# Patient Record
Sex: Female | Born: 1970
Health system: Southern US, Community
[De-identification: ages and names within clinical notes are randomized; demographics above are authoritative.]

## PROBLEM LIST (undated history)

## (undated) DIAGNOSIS — E119 Type 2 diabetes mellitus without complications: Secondary | ICD-10-CM

## (undated) DIAGNOSIS — R748 Abnormal levels of other serum enzymes: Secondary | ICD-10-CM

## (undated) DIAGNOSIS — I499 Cardiac arrhythmia, unspecified: Secondary | ICD-10-CM

## (undated) DIAGNOSIS — K219 Gastro-esophageal reflux disease without esophagitis: Secondary | ICD-10-CM

## (undated) DIAGNOSIS — R011 Cardiac murmur, unspecified: Secondary | ICD-10-CM

## (undated) DIAGNOSIS — Z8489 Family history of other specified conditions: Secondary | ICD-10-CM

## (undated) DIAGNOSIS — K439 Ventral hernia without obstruction or gangrene: Secondary | ICD-10-CM

## (undated) DIAGNOSIS — K449 Diaphragmatic hernia without obstruction or gangrene: Secondary | ICD-10-CM

## (undated) DIAGNOSIS — K8062 Calculus of gallbladder and bile duct with acute cholecystitis without obstruction: Secondary | ICD-10-CM

## (undated) HISTORY — PX: OOPHORECTOMY: SHX86

## (undated) HISTORY — PX: TUBAL LIGATION: SHX77

---

## 2003-12-10 ENCOUNTER — Ambulatory Visit: Payer: Self-pay | Admitting: Family Medicine

## 2011-06-10 ENCOUNTER — Emergency Department: Payer: Self-pay | Admitting: Emergency Medicine

## 2011-06-10 LAB — COMPREHENSIVE METABOLIC PANEL
Albumin: 3.7 g/dL (ref 3.4–5.0)
Alkaline Phosphatase: 114 U/L (ref 50–136)
BUN: 12 mg/dL (ref 7–18)
Chloride: 105 mmol/L (ref 98–107)
EGFR (African American): >60
EGFR (Non-African Amer.): >60
Potassium: 3.9 mmol/L (ref 3.5–5.1)
SGOT(AST): 24 U/L (ref 15–37)
SGPT (ALT): 31 U/L
Sodium: 140 mmol/L (ref 136–145)

## 2011-06-10 LAB — URINALYSIS, COMPLETE
Glucose,UR: NEGATIVE mg/dL (ref 0–75)
Leukocyte Esterase: NEGATIVE
Nitrite: NEGATIVE
Protein: NEGATIVE
RBC,UR: 1 /HPF (ref 0–5)
Specific Gravity: 1.015 (ref 1.003–1.030)
Squamous Epithelial: <1
WBC UR: <1 /HPF (ref 0–5)

## 2011-06-10 LAB — CBC
MCV: 89 fL (ref 80–100)
Platelet: 211 10*3/uL (ref 150–440)
RBC: 3.97 10*6/uL (ref 3.80–5.20)
RDW: 13.2 % (ref 11.5–14.5)
WBC: 11.1 10*3/uL — ABNORMAL HIGH (ref 3.6–11.0)

## 2011-06-10 LAB — TROPONIN I: Troponin-I: <0.02 ng/mL

## 2013-07-05 ENCOUNTER — Ambulatory Visit: Payer: Self-pay | Admitting: Family Medicine

## 2013-07-12 ENCOUNTER — Ambulatory Visit: Payer: Self-pay | Admitting: Family Medicine

## 2014-10-16 ENCOUNTER — Other Ambulatory Visit: Payer: Self-pay | Admitting: Family Medicine

## 2014-10-16 DIAGNOSIS — Z1231 Encounter for screening mammogram for malignant neoplasm of breast: Secondary | ICD-10-CM

## 2014-10-23 ENCOUNTER — Ambulatory Visit
Admission: RE | Admit: 2014-10-23 | Discharge: 2014-10-23 | Disposition: A | Discharge status: Home / Self Care | Payer: 59 | Source: Ambulatory Visit | Attending: Family Medicine | Admitting: Family Medicine

## 2014-10-23 DIAGNOSIS — Z1231 Encounter for screening mammogram for malignant neoplasm of breast: Secondary | ICD-10-CM | POA: Insufficient documentation

## 2015-03-07 IMAGING — MG MM ADDITIONAL VIEWS AT NO CHARGE
1 series · 2 of 2 positions shown · non-contrast
Comparison: 07/05/2013

CLINICAL DATA: The patient returns after baseline screening exam
for evaluation of the left breast.

EXAM:
DIGITAL DIAGNOSTIC  LEFT MAMMOGRAM
ULTRASOUND LEFT BREAST

[L CC · left · 2 of 2 slices shown]
[im 1/2]
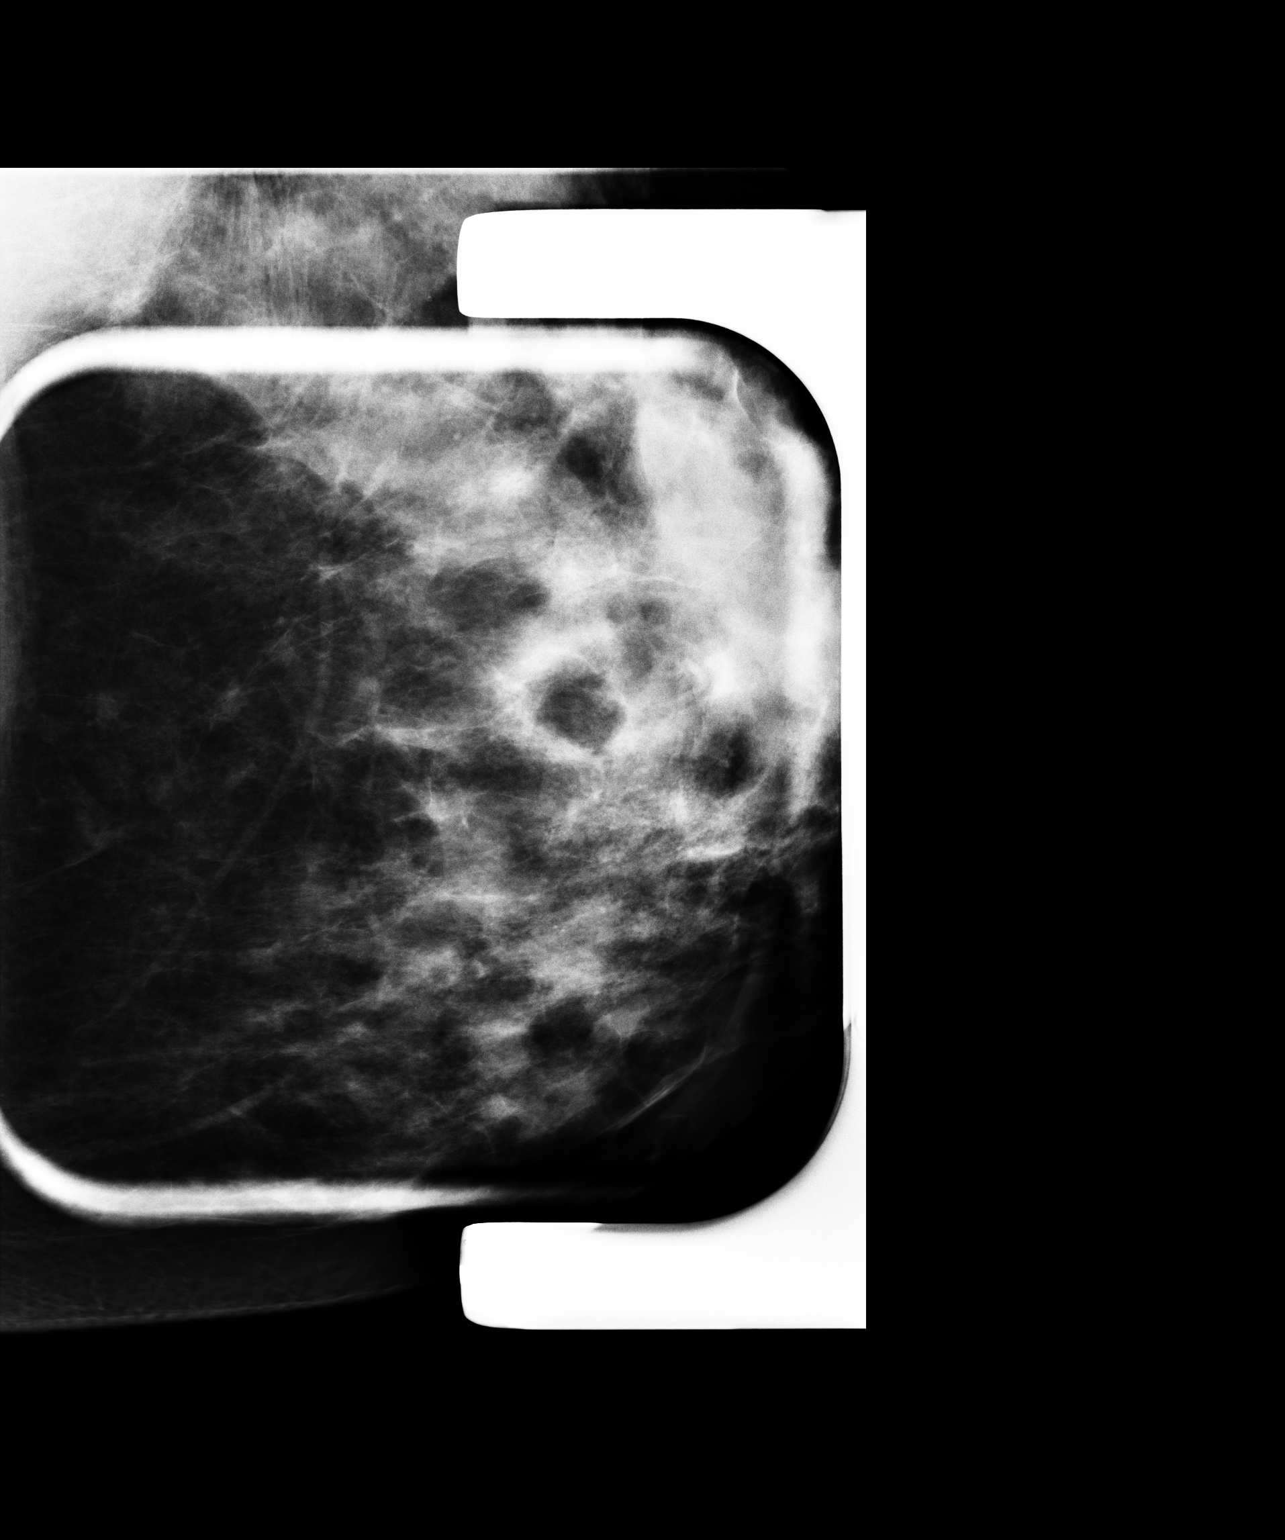
[im 2/2]
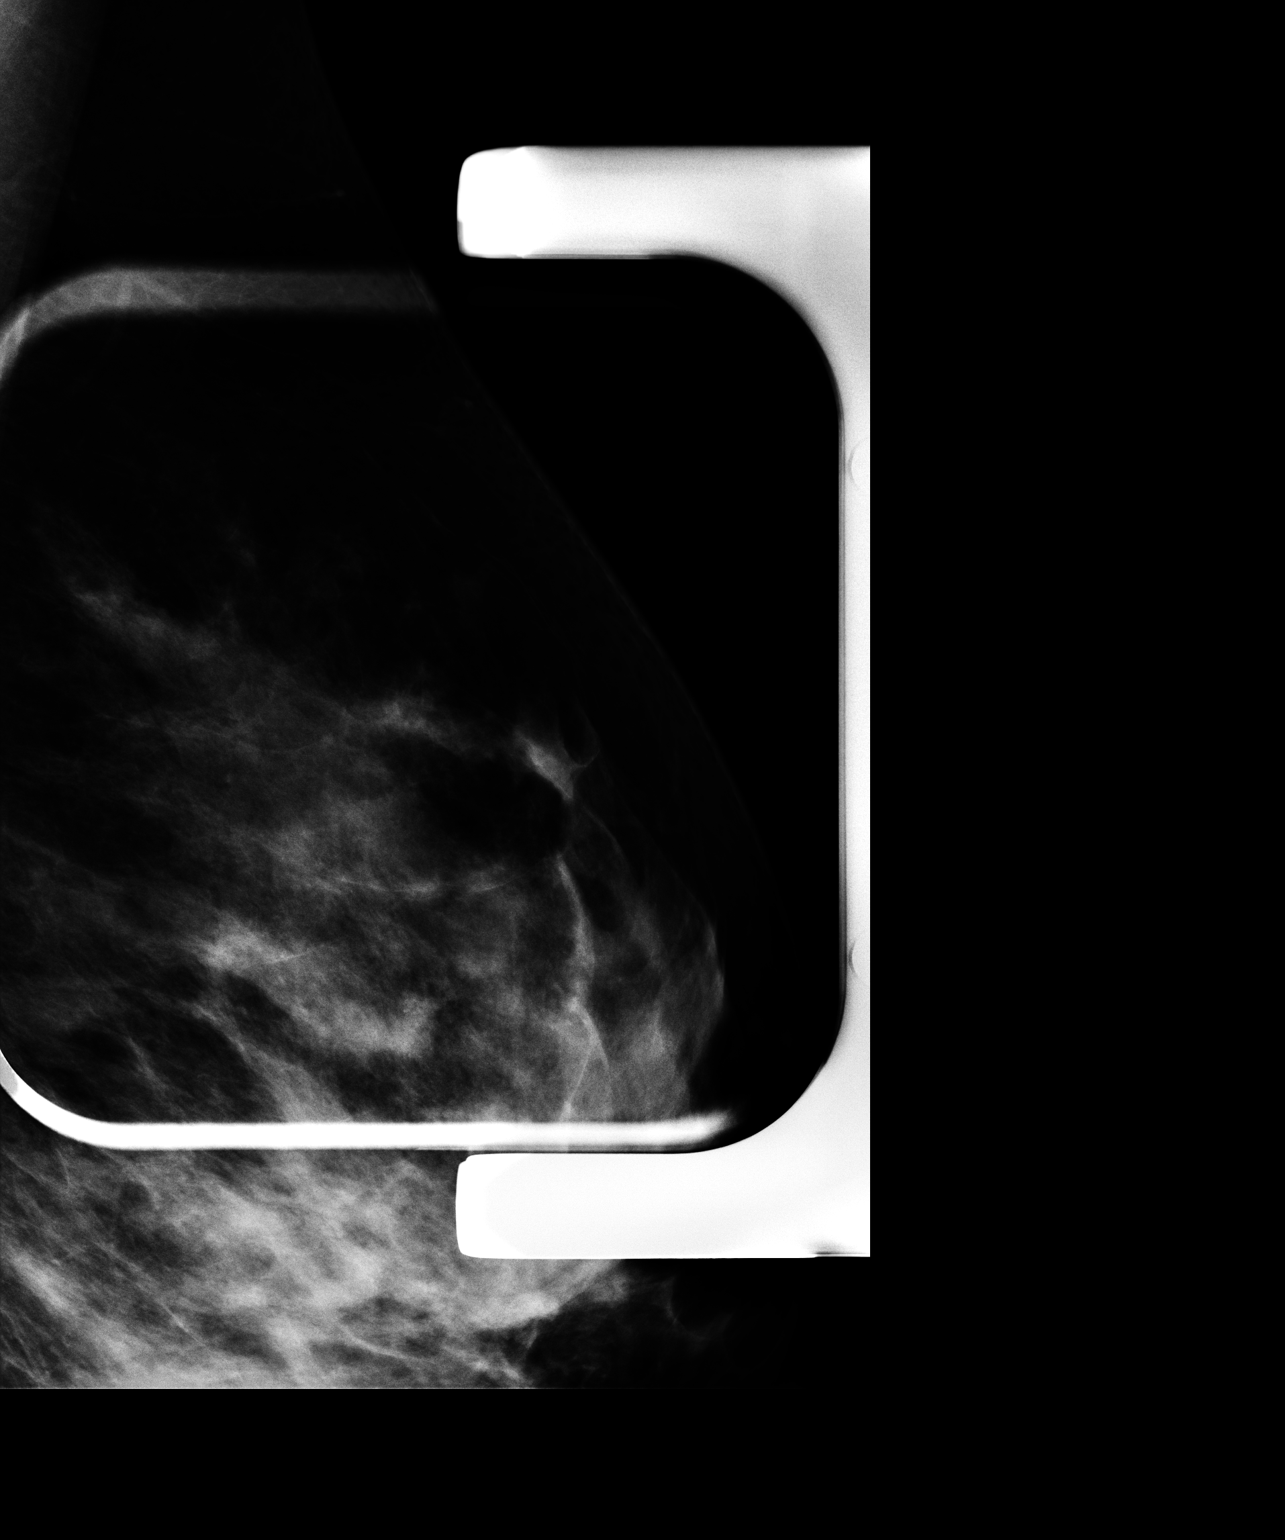

[2 of 2 positions shown; findings below may reference images not displayed]

ACR Breast Density Category c: The breast tissue is heterogeneously
dense, which may obscure small masses.
FINDINGS: Additional views are performed showing normal appearing
fibroglandular tissue in the upper central portion of the left
breast. No suspicious mass, distortion, or microcalcifications are
identified to suggest presence of malignancy.

On physical exam, I palpate no abnormality in the upper central
portion of the left breast.

Ultrasound is performed, showing normal appearing fibroglandular
tissue in the upper central aspect of the left breast. No
distortion, or acoustic shadowing is demonstrated with ultrasound.
IMPRESSION: No mammographic or ultrasound evidence for malignancy.

RECOMMENDATION:
Screening mammogram in one year.(Code:UK-7-7XV)

I have discussed the findings and recommendations with the patient.
Results were also provided in writing at the conclusion of the
visit. If applicable, a reminder letter will be sent to the patient
regarding the next appointment.

BI-RADS CATEGORY  1: Negative.

## 2015-03-07 IMAGING — US US BREAST*L* LIMITED INC AXILLA
1 series · 4 of 4 positions shown · non-contrast
Comparison: 07/05/2013

CLINICAL DATA: The patient returns after baseline screening exam
for evaluation of the left breast.

EXAM:
DIGITAL DIAGNOSTIC  LEFT MAMMOGRAM
ULTRASOUND LEFT BREAST

[Series 1: us breast*left* limited inc axilla · 0.08mm/px · 4 of 4 slices shown]
[im 1/4]
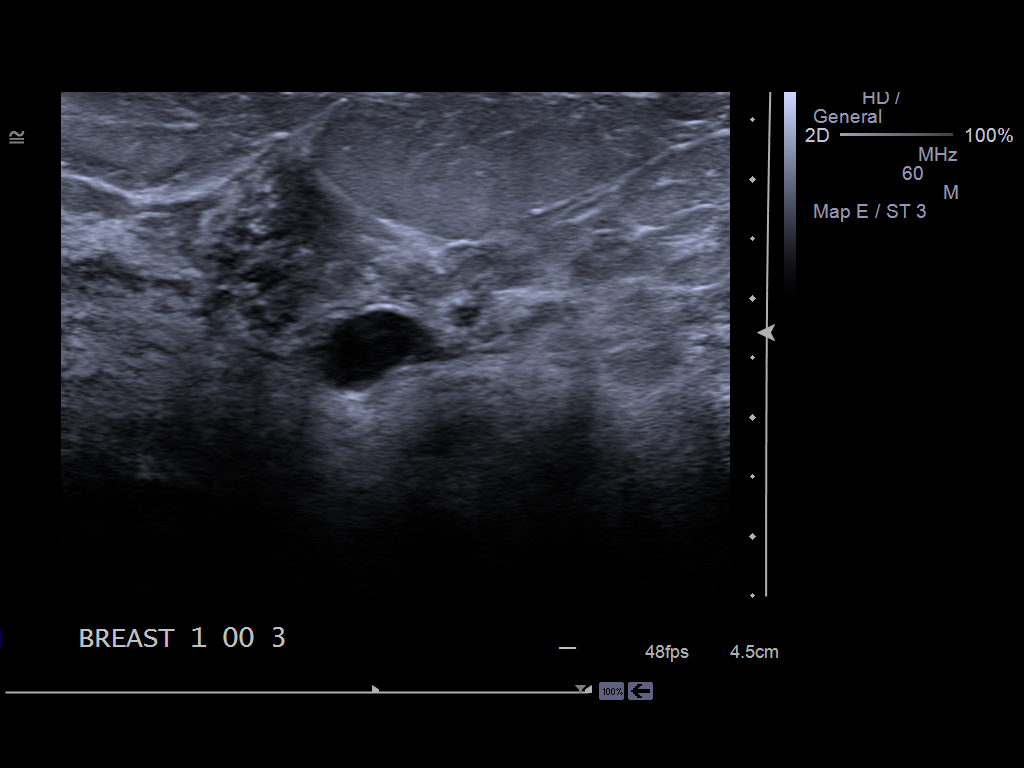
[im 2/4]
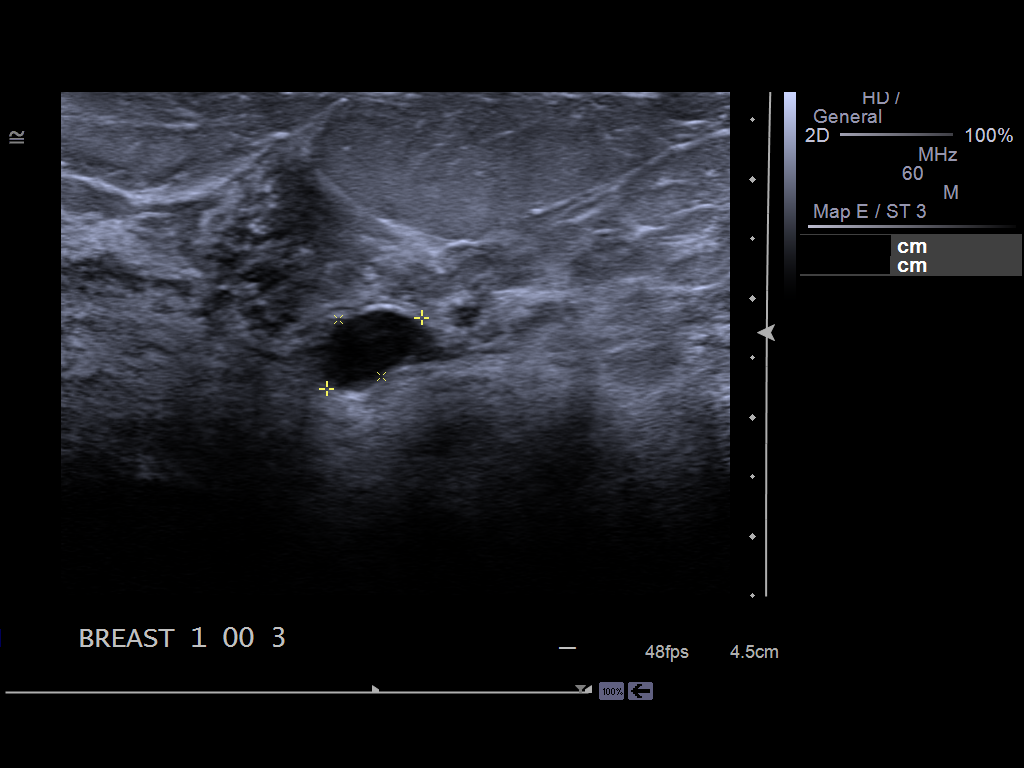
[im 3/4]
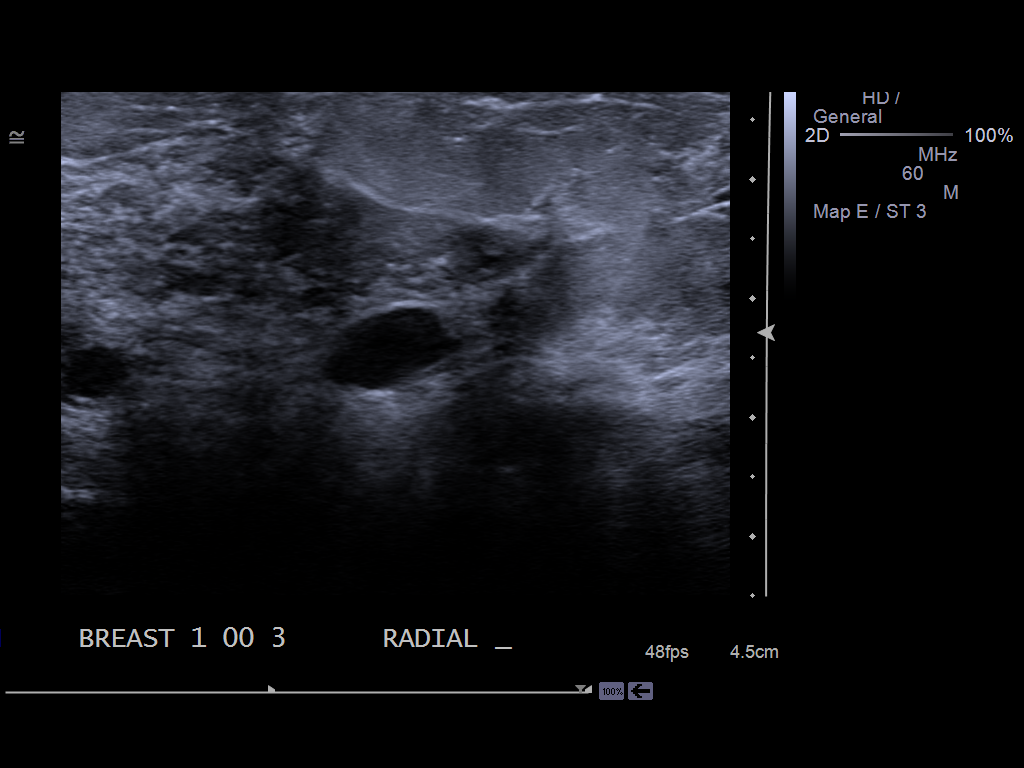
[im 4/4]
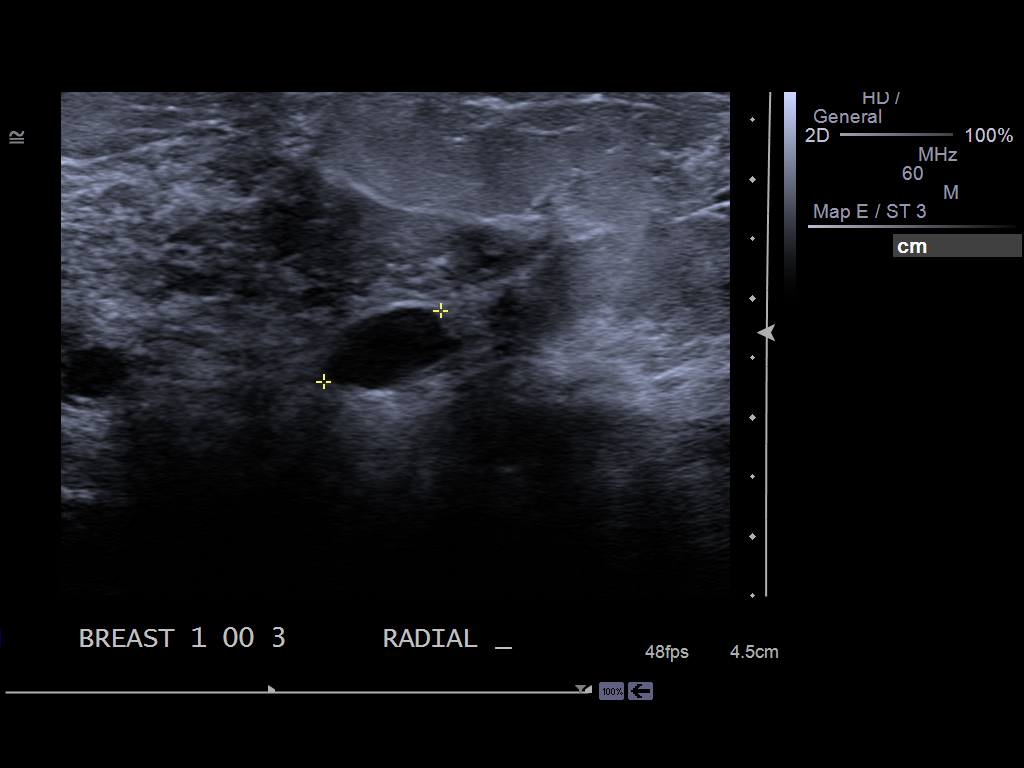

[4 of 4 positions shown; findings below may reference images not displayed]

ACR Breast Density Category c: The breast tissue is heterogeneously
dense, which may obscure small masses.
FINDINGS: Additional views are performed showing normal appearing
fibroglandular tissue in the upper central portion of the left
breast. No suspicious mass, distortion, or microcalcifications are
identified to suggest presence of malignancy.

On physical exam, I palpate no abnormality in the upper central
portion of the left breast.

Ultrasound is performed, showing normal appearing fibroglandular
tissue in the upper central aspect of the left breast. No
distortion, or acoustic shadowing is demonstrated with ultrasound.
IMPRESSION: No mammographic or ultrasound evidence for malignancy.

RECOMMENDATION:
Screening mammogram in one year.(Code:UK-7-7XV)

I have discussed the findings and recommendations with the patient.
Results were also provided in writing at the conclusion of the
visit. If applicable, a reminder letter will be sent to the patient
regarding the next appointment.

BI-RADS CATEGORY  1: Negative.

## 2015-04-14 ENCOUNTER — Other Ambulatory Visit: Payer: Self-pay

## 2015-04-14 ENCOUNTER — Encounter: Payer: Self-pay | Admitting: Emergency Medicine

## 2015-04-14 ENCOUNTER — Emergency Department
Admission: EM | Admit: 2015-04-14 | Discharge: 2015-04-14 | Disposition: A | Discharge status: Home / Self Care | Payer: 59 | Attending: Emergency Medicine | Admitting: Emergency Medicine

## 2015-04-14 ENCOUNTER — Emergency Department: Payer: 59

## 2015-04-14 DIAGNOSIS — B349 Viral infection, unspecified: Secondary | ICD-10-CM | POA: Diagnosis not present

## 2015-04-14 DIAGNOSIS — J690 Pneumonitis due to inhalation of food and vomit: Secondary | ICD-10-CM | POA: Diagnosis not present

## 2015-04-14 DIAGNOSIS — R079 Chest pain, unspecified: Secondary | ICD-10-CM | POA: Diagnosis present

## 2015-04-14 DIAGNOSIS — J159 Unspecified bacterial pneumonia: Secondary | ICD-10-CM | POA: Insufficient documentation

## 2015-04-14 LAB — URINALYSIS COMPLETE WITH MICROSCOPIC (ARMC ONLY)
BACTERIA UA: NONE SEEN
Bilirubin Urine: NEGATIVE
Glucose, UA: 50 mg/dL — AB
Ketones, ur: NEGATIVE mg/dL
Leukocytes, UA: NEGATIVE
Nitrite: NEGATIVE
PH: 5 (ref 5.0–8.0)
PROTEIN: NEGATIVE mg/dL
SPECIFIC GRAVITY, URINE: 1.013 (ref 1.005–1.030)
Squamous Epithelial / LPF: NONE SEEN
WBC UA: NONE SEEN WBC/hpf (ref 0–5)

## 2015-04-14 LAB — CBC
HEMATOCRIT: 36.6 % (ref 35.0–47.0)
HEMOGLOBIN: 12.2 g/dL (ref 12.0–16.0)
MCH: 27.8 pg (ref 26.0–34.0)
MCHC: 33.2 g/dL (ref 32.0–36.0)
MCV: 83.7 fL (ref 80.0–100.0)
Platelets: 246 10*3/uL (ref 150–440)
RBC: 4.37 MIL/uL (ref 3.80–5.20)
RDW: 13.8 % (ref 11.5–14.5)
WBC: 15.4 10*3/uL — ABNORMAL HIGH (ref 3.6–11.0)

## 2015-04-14 LAB — BASIC METABOLIC PANEL
Anion gap: 6 (ref 5–15)
BUN: 12 mg/dL (ref 6–20)
CHLORIDE: 107 mmol/L (ref 101–111)
CO2: 24 mmol/L (ref 22–32)
Calcium: 8.7 mg/dL — ABNORMAL LOW (ref 8.9–10.3)
Creatinine, Ser: 0.8 mg/dL (ref 0.44–1.00)
GFR calc Af Amer: >60 mL/min (ref >60)
GFR calc non Af Amer: >60 mL/min (ref >60)
GLUCOSE: 200 mg/dL — AB (ref 65–99)
POTASSIUM: 3.6 mmol/L (ref 3.5–5.1)
Sodium: 137 mmol/L (ref 135–145)

## 2015-04-14 LAB — TROPONIN I: Troponin I: <0.03 ng/mL (ref <0.031)

## 2015-04-14 MED ORDER — IBUPROFEN 400 MG PO TABS
600.0000 mg | ORAL_TABLET | Freq: Once | ORAL | Status: AC
  Administered 2015-04-14: 600 mg via ORAL

## 2015-04-14 MED ORDER — AZITHROMYCIN 250 MG PO TABS: ORAL_TABLET | ORAL | Status: DC

## 2015-04-14 MED ORDER — ONDANSETRON HCL 4 MG PO TABS: 4.0000 mg | ORAL_TABLET | Freq: Three times a day (TID) | ORAL | Status: DC | PRN

## 2015-04-14 MED ORDER — IBUPROFEN 400 MG PO TABS
ORAL_TABLET | ORAL | Status: AC
  Administered 2015-04-14: 600 mg via ORAL
  Filled 2015-04-14: qty 2

## 2015-04-14 NOTE — Discharge Instructions (Signed)
Neumona extrahospitalaria en los adultos (Community-Acquired Pneumonia, Adult) La neumona es una infeccin en los pulmones. La neumona puede contagiarse mientras una persona est hospitalizada. Cuando una persona no est en el hospital, puede tener un tipo diferente de la enfermedad (neumona extrahospitalaria), la cual se transmite fcilmente de Neomia Dear persona a Educational psychologist. Una persona puede contraer neumona extrahospitalaria si respira cerca de un individuo infectado que tose o estornuda. Algunos sntomas incluyen lo siguiente:  Tos seca.  Tos con expectoracin (productiva).  Grant Ruts.  Sudoracin.  Dolor en el pecho.  CUIDADOS EN EL HOGAR  Tome los medicamentos de venta libre y los recetados solamente como se lo haya indicado el mdico.  Tome los medicamentos para la tos solamente si no puede dormir bien.  Si le recetaron un antibitico, tmelo como se lo haya indicado el mdico. No deje de tomar los antibiticos aunque comience a Actor.  Duerma con la cabeza y el cuello elevados. Para lograrlo, colquese algunas almohadas debajo de la cabeza, o bien puede dormir en un silln reclinable.  No consuma productos que contengan tabaco. Estos incluyen cigarrillos, tabaco para mascar y Administrator, Civil Service. Si necesita ayuda para dejar de fumar, consulte al mdico.  Beba suficiente agua para mantener la orina clara o de color amarillo plido. Una vacuna puede ayudar a prevenir la neumona. Habitualmente, la vacuna se recomienda a:  Las [employer] de 16XWR.  Las [employer] de 19aos:  Las personas que estn recibiendo tratamiento para Management consultant.  Las personas que tienen una enfermedad pulmonar a largo plazo (crnica).  Las [employer] tienen problemas del sistema de defensa del organismo (sistema inmunitario). Tambin puede evitar la neumona si toma las siguientes medidas:  Se aplica la vacuna antigripal todos los [place].  Visita al dentista con la frecuencia  indicada.  Se lava las manos a menudo. Utilice un desinfectante para manos si no dispone de [place] y [place]. SOLICITE AYUDA SI:  Tiene fiebre.  No puede dormir bien porque el medicamento para la tos no resulta eficaz. SOLICITE AYUDA DE INMEDIATO SI:  Le falta el aire y este sntoma [place].  Aumenta el dolor en el pecho.  La enfermedad empeora. Esto es muy grave si:  Es un adulto mayor.  El sistema de defensa del organismo est debilitado.  Tose y escupe sangre.   Esta informacin no tiene Theme park manager el consejo del mdico. Asegrese de hacerle al mdico cualquier pregunta que tenga.   Document Released: 07/29/2009 Document Revised: 10/30/2014 Elsevier Interactive Patient Education [employer]  Please return immediately if condition worsens. Please contact her primary physician or the physician you were given for referral. If you have any specialist physicians involved in her treatment and plan please also contact them. Thank you for using  regional emergency Department. These drink plenty of fluids and return here if he get persistent shortness of breath, high fever,, or any other new concerns

## 2015-04-14 NOTE — ED Notes (Signed)
MD at bedside. 

## 2015-04-14 NOTE — ED Notes (Signed)
Patient to [facility] today to be seen for c/o chest pain.  States pain began last night at 2330. Patient also c/o cough that started today.

## 2015-04-14 NOTE — ED Provider Notes (Signed)
Time Seen: Approximately 1800  I have reviewed the triage notes  Chief Complaint: Chest Pain   History of Present Illness: Amanda Ashley is a 45 y.o. female *who presents with referral from [facility] clinic for evaluation of chest pain. Patient states her chest pain started last night at 11:30 PM and lasted throughout the day today. She states she's had a persistent cough which is been overall nonproductive. She denies any nausea, vomiting. She's had some mild shortness of breath. She denies any pulmonary emboli risk factors. She has no cardiac risk factors at this time. She has had some occasional nausea with no persistent vomiting today. But states she did vomit multiple times last night. She denies any loose stool or diarrhea. She denies any melena or hematochezia. Patient is Hispanic though seems to speak and understand English at this time.   History reviewed. No pertinent past medical history.  There are no active problems to display for this patient.   History reviewed. No pertinent past surgical history.  History reviewed. No pertinent past surgical history.  Current Outpatient Rx  Name  Route  Sig  Dispense  Refill  . azithromycin (ZITHROMAX Z-PAK) 250 MG tablet      Take 2 tablets (500 mg) on  Day 1,  followed by 1 tablet (250 mg) once daily on Days 2 through 5.   6 each   0   . ondansetron (ZOFRAN) 4 MG tablet   Oral   Take 1 tablet (4 mg total) by mouth every 8 (eight) hours as needed for nausea or vomiting.   21 tablet   0     Allergies:  Review of patient's allergies indicates no known allergies.  Family History: No family history on file.  Social History: Social History  Substance Use Topics  . Smoking status: Never Smoker   . Smokeless tobacco: Never Used  . Alcohol Use: No     Review of Systems:   10 point review of systems was performed and was otherwise negative:  Constitutional: No fever Eyes: No visual disturbances ENT: No sore  throat, ear pain Cardiac: Chest pain is substernal and is worse with coughing. She denies any radiation of pain to the arm or jaw area. Respiratory: No shortness of breath, wheezing, or stridor Abdomen: No abdominal pain, no vomiting, No diarrhea Endocrine: No weight loss, No night sweats Extremities: No peripheral edema, cyanosis Skin: No rashes, easy bruising Neurologic: No focal weakness, trouble with speech or swollowing Urologic: No dysuria, Hematuria, or urinary frequency   Physical Exam:  ED Triage Vitals  Enc Vitals Group     BP 04/14/15 1551 128/66 mmHg     Pulse Rate 04/14/15 1551 104     Resp 04/14/15 1551 16     Temp 04/14/15 1551 98.9 F (37.2 C)     Temp Source 04/14/15 1551 Oral     SpO2 04/14/15 1551 97 %     Weight 04/14/15 1551 224 lb (101.606 kg)     Height 04/14/15 1551  (1.651 m)     Head Cir --      Peak Flow --      Pain Score 04/14/15 1552 6     Pain Loc --      Pain Edu? --      Excl. in GC? --     General: Awake , Alert , and Oriented times 3; GCS 15 Head: Normal cephalic , atraumatic Eyes: Pupils equal , round, reactive to light  Nose/Throat: No nasal drainage, patent upper airway without erythema or exudate.  Neck: Supple, Full range of motion, No anterior adenopathy or palpable thyroid masses Lungs: Clear to ascultation without wheezes , rhonchi, or rales Heart: Regular rate, regular rhythm without murmurs , gallops , or rubs Abdomen: Soft, non tender without rebound, guarding , or rigidity; bowel sounds positive and symmetric in all 4 quadrants. No organomegaly .        Extremities: 2 plus symmetric pulses. No edema, clubbing or cyanosis Neurologic: normal ambulation, Motor symmetric without deficits, sensory intact Skin: warm, dry, no rashes Patient's chest discomfort seems to be reproducible palpation across the upper chest wall region without any crepitus or step-off noted  Labs:   All laboratory work was reviewed including any  pertinent negatives or positives listed below:  Labs Reviewed  BASIC METABOLIC PANEL - Abnormal; Notable for the following:    Glucose, Bld 200 (*)    Calcium 8.7 (*)    All other components within normal limits  CBC - Abnormal; Notable for the following:    WBC 15.4 (*)    All other components within normal limits  URINALYSIS COMPLETEWITH MICROSCOPIC ([facility] ONLY) - Abnormal; Notable for the following:    Color, Urine YELLOW (*)    APPearance CLEAR (*)    Glucose, UA 50 (*)    Hgb urine dipstick 2+ (*)    All other components within normal limits  TROPONIN I   Laboratory work shows a slightly elevated white blood cell count otherwise within normal limits. EKG: *  ED ECG REPORT I, Jennye Moccasin, the attending physician, personally viewed and interpreted this ECG.  Date: 04/14/2015 EKG Time: *1546 Rate: 108 Rhythm: Sinus tachycardia  QRS Axis: normal Intervals: normal ST/T Wave abnormalities: Nonspecific ST-T wave changes Conduction Disturbances: none Narrative Interpretation: unremarkable No acute ischemic change   Radiology:     Final result by Rad Results In Interface (04/14/15 16:51:36)   Narrative:   CLINICAL DATA: Chest pain for 2 days  EXAM: CHEST 2 VIEW  COMPARISON: None.  FINDINGS: Cardiac shadow is within normal limits. Mild increased density is noted in the lingula on the left. No sizable effusion is seen. No bony abnormality is noted.  IMPRESSION: Lingular atelectasis.   Electronically Signed By: Alcide Clever M.D. On: 04/14/2015 16:51        I personally reviewed the radiologic studies    ED Course: Differential includes all life-threatening causes for chest pain. This includes but is not exclusive to acute coronary syndrome, aortic dissection, pulmonary embolism, cardiac tamponade, community-acquired pneumonia, pericarditis, musculoskeletal chest wall pain, etc.  Patient seems to have a viral bronchitis versus early  pneumonia picture. I reviewed her x-rays which to me show a slight left lower lobe infiltrate. She has an elevated white blood cell count with persistent cough. I felt this was unlikely to be pulmonary embolism based on her lack of risk factors and with her having pain since last night. No significant findings of ischemia on her EKG nor on her enzyme evaluation. Patient I felt could be treated on an outpatient basis and she was prescribed a Zithromax for community-acquired pneumonia and also given a prescription for Zofran and advised drink plenty of fluids.      Assessment: * Viral syndrome Early community-acquired pneumonia   Plan:  Outpatient management Patient was advised to return immediately if condition worsens. Patient was advised to follow up with their primary care physician or other specialized physicians involved in their outpatient  care            Jennye Moccasin, MD 04/14/15 2252

## 2015-09-02 ENCOUNTER — Encounter: Payer: Self-pay | Admitting: Sports Medicine

## 2015-09-02 ENCOUNTER — Ambulatory Visit (INDEPENDENT_AMBULATORY_CARE_PROVIDER_SITE_OTHER): Payer: 59 | Admitting: Sports Medicine

## 2015-09-02 DIAGNOSIS — M79675 Pain in left toe(s): Secondary | ICD-10-CM

## 2015-09-02 DIAGNOSIS — L603 Nail dystrophy: Secondary | ICD-10-CM

## 2015-09-02 NOTE — Progress Notes (Signed)
Patient ID: Amanda Ashley, female   DOB: 15-Jul-1970, 45 y.o.   MRN: [id number]  Subjective: Amanda Ashley is a 45 y.o. female patient seen today in office with complaint of painful thickened and discolored nails especially at left 1st toe reports 4 years ago had nail removed but grew back thick and discolored; assisted by Daughter who states that she may have had culture in past that was negative and that's why the nail was removed. Patient is desiring treatment for nail changes. Reports that nails are becoming difficult to manage because of the thickness and pain especially at left big toenail. Patient has no other pedal complaints at this time.   There are no active problems to display for this patient.   No current outpatient prescriptions on file prior to visit.   No current facility-administered medications on file prior to visit.    No Known Allergies  Objective: Physical Exam  General: Well developed, nourished, no acute distress, awake, alert and oriented x 3  Vascular: Dorsalis pedis artery 2/4 bilateral, Posterior tibial artery 2/4 bilateral, skin temperature warm to warm proximal to distal bilateral lower extremities, no varicosities, pedal hair present bilateral.  Neurological: Gross sensation present via light touch bilateral.   Dermatological: Skin is warm, dry, and supple bilateral, Nails 1-10 are tender, short thick, and discolored with mild subungal debris especially at left 1st, 5th and right 3rd toenails, no webspace macerations present bilateral, no open lesions present bilateral, no callus/corns/hyperkeratotic tissue present bilateral. No signs of infection bilateral.  Musculoskeletal: No symptomatic boney deformities noted bilateral. Muscular strength within normal limits without painon range of motion. No pain with calf compression bilateral.  Assessment and Plan:  Problem List Items Addressed This Visit    None    Visit Diagnoses    Toe pain, left     -  Primary    Nail dystrophy          -Examined patient -Discussed treatment options for painful dystrophic nails  -Fungal culture was obtained from left 1st and 5th and right 3rd toenails [facility] -Patient to return in 4 weeks for follow up evaluation and discussion of fungal culture results or sooner if symptoms worsen.  Asencion Islamitorya Khya Halls, DPM

## 2015-09-30 ENCOUNTER — Ambulatory Visit: Payer: 59 | Admitting: Sports Medicine

## 2015-10-03 ENCOUNTER — Ambulatory Visit (INDEPENDENT_AMBULATORY_CARE_PROVIDER_SITE_OTHER): Payer: 59 | Admitting: Sports Medicine

## 2015-10-03 ENCOUNTER — Encounter: Payer: Self-pay | Admitting: Sports Medicine

## 2015-10-03 DIAGNOSIS — M79676 Pain in unspecified toe(s): Secondary | ICD-10-CM | POA: Diagnosis not present

## 2015-10-03 DIAGNOSIS — B353 Tinea pedis: Secondary | ICD-10-CM | POA: Diagnosis not present

## 2015-10-03 DIAGNOSIS — B351 Tinea unguium: Secondary | ICD-10-CM | POA: Diagnosis not present

## 2015-10-03 NOTE — Progress Notes (Signed)
Subjective: Paula Comptonna M VAZQUEZ REYES is a 45 y.o. female patient seen today in office for fungal culture results. Patient has no other pedal complaints at this time.   There are no active problems to display for this patient.   No current outpatient prescriptions on file prior to visit.   No current facility-administered medications on file prior to visit.     No Known Allergies  Objective: Physical Exam  General: Well developed, nourished, no acute distress, awake, alert and oriented x 3  Vascular: Dorsalis pedis artery 2/4 bilateral, Posterior tibial artery 2/4 bilateral, skin temperature warm to warm proximal to distal bilateral lower extremities, no varicosities, pedal hair present bilateral.  Neurological: Gross sensation present via light touch bilateral.   Dermatological: Skin is warm, dry, and supple bilateral, Nails 1-10 are tender, short thick, and discolored with mild subungal debris with most involved at right 3rd and left 1st and 5th toenails, no webspace macerations present bilateral, no open lesions present bilateral, no callus/corns/hyperkeratotic tissue present bilateral. No signs of infection bilateral.  Musculoskeletal: No symptomatic boney deformities noted bilateral. Muscular strength within normal limits without painon range of motion. No pain with calf compression bilateral.  Fungal culture +T. Rubrum  Assessment and Plan:  Problem List Items Addressed This Visit    None    Visit Diagnoses    Onychomycosis due to Trichophyton rubrum    -  Primary   Relevant Orders   Hepatic Function Panel   Tinea pedis, unspecified laterality       Pain of toe, unspecified laterality         -Examined patient -Discussed treatment options for painful mycotic nails -Patient opt for oral Lamisil with full understanding of medication risks; ordered LFTs for review if within normal limits will proceed with sending Rx to pharmacy for lamisil 250mg  PO daily. Anticipate 12 week  course.  -Advised good hygiene habits -Patient to return in 6 weeks for follow up evaluation/medication check or sooner if symptoms worsen.  Asencion Islamitorya Axiel Fjeld, DPM

## 2015-10-09 LAB — HEPATIC FUNCTION PANEL
ALBUMIN: 4 g/dL (ref 3.5–5.5)
ALT: 20 IU/L (ref 0–32)
AST: 17 IU/L (ref 0–40)
Alkaline Phosphatase: 133 IU/L — ABNORMAL HIGH (ref 39–117)
BILIRUBIN TOTAL: 0.2 mg/dL (ref 0.0–1.2)
Bilirubin, Direct: 0.07 mg/dL (ref 0.00–0.40)
TOTAL PROTEIN: 6.9 g/dL (ref 6.0–8.5)

## 2015-10-10 ENCOUNTER — Telehealth: Payer: Self-pay | Admitting: *Deleted

## 2015-10-10 NOTE — Telephone Encounter (Addendum)
-----   Message from Asencion Islamitorya Stover, [place] sent at 10/06/2015 11:37 AM EDT ----- Call patient to inform her that her Alkaline Phosphatase is marginally high. Will plan to repeat blood work when she comes to office in 6 weeks. If the labs are still high then will ask patient to follow up with PCP to determine the cause of elevated ALP.  Higher than normal levels of ALP in your blood may indicate a problem with your liver or gallbladder. This could include hepatitis (liver inflammation), cirrhosis (liver scarring), liver cancer, gallstones, or a blockage in your bile ducts. Also these values can be elevated when you are dehydrated or secondary to prescription medications.  -Dr. Marylene LandStover 10/10/2015-Informed pt of Dr. Wynema BirchStover's review of labs and concerns and told pt to make an appt for 6 weeks.

## 2015-11-14 ENCOUNTER — Ambulatory Visit (INDEPENDENT_AMBULATORY_CARE_PROVIDER_SITE_OTHER): Payer: 59 | Admitting: Podiatry

## 2015-11-14 DIAGNOSIS — B351 Tinea unguium: Secondary | ICD-10-CM | POA: Diagnosis not present

## 2015-11-14 DIAGNOSIS — L609 Nail disorder, unspecified: Secondary | ICD-10-CM

## 2015-11-14 DIAGNOSIS — M79609 Pain in unspecified limb: Secondary | ICD-10-CM

## 2015-11-14 DIAGNOSIS — L603 Nail dystrophy: Secondary | ICD-10-CM

## 2015-11-14 DIAGNOSIS — Z79899 Other long term (current) drug therapy: Secondary | ICD-10-CM

## 2015-11-14 DIAGNOSIS — L608 Other nail disorders: Secondary | ICD-10-CM

## 2015-11-15 LAB — HEPATIC FUNCTION PANEL
ALT: 15 IU/L (ref 0–32)
AST: 15 IU/L (ref 0–40)
Albumin: 4.4 g/dL (ref 3.5–5.5)
Alkaline Phosphatase: 138 IU/L — ABNORMAL HIGH (ref 39–117)
Bilirubin Total: 0.2 mg/dL (ref 0.0–1.2)
Bilirubin, Direct: 0.04 mg/dL (ref 0.00–0.40)
Total Protein: 7.1 g/dL (ref 6.0–8.5)

## 2015-11-16 NOTE — Progress Notes (Signed)
Subjective: Patient presents today for possible treatment and evaluation of fungal nails bilaterally 1 through 5. Patient states that the nails have been discolored and thickened for greater than 1 month. Patient presents today for further treatment and evaluation.  Patient states that approximately 6 weeks prior to presentation she did have an LFT performed at which time she had high alkaline phosphatase levels. She was unable to begin oral antifungal medication treatment therapy.  Objective: Physical Exam General: The patient is alert and oriented x3 in no acute distress.  Dermatology: Hyperkeratotic, discolored, thickened, onychodystrophy of nails noted bilaterally.  Skin is warm, dry and supple bilateral lower extremities. Negative for open lesions or macerations.  Vascular: Palpable pedal pulses bilaterally. No edema or erythema noted. Capillary refill within normal limits.  Neurological: Epicritic and protective threshold grossly intact bilaterally.   Musculoskeletal Exam: Range of motion within normal limits to all pedal and ankle joints bilateral. Muscle strength 5/5 in all groups bilateral.   Assessment: #1 onychodystrophy bilateral toenails #2 possible onychomycosis #3 hyperkeratotic nails bilateral  Problem List Items Addressed This Visit    None    Visit Diagnoses    Encounter for long-term (current) use of medications    -  Primary   Relevant Orders   Hepatic Function Panel (Completed)      Plan of Care:  #1 Patient was evaluated. #2 Today we are going to reorder the liver function tests to see if her alkaline phosphatase levels have dropped down to normal levels. #3. LFTs are normal prescription for Lamisil oral and Naftin 2% gel will be prescribed for the patient. #4 patient is to return to the clinic in 3 months   Dr. Felecia ShellingBrent M. Nelsy Madonna, DPM [facility]

## 2015-11-17 ENCOUNTER — Other Ambulatory Visit: Payer: Self-pay

## 2015-11-17 DIAGNOSIS — Z79899 Other long term (current) drug therapy: Secondary | ICD-10-CM

## 2015-11-18 ENCOUNTER — Other Ambulatory Visit: Payer: Self-pay | Admitting: Podiatry

## 2015-11-19 LAB — GAMMA GT: GGT: 18 IU/L (ref 0–60)

## 2015-11-19 NOTE — Progress Notes (Signed)
Let patient know her GGT levels are normal, meaning that her elevated AP levels are coming from somewhere else in her body, likely bone.   Follow up with her PCP. Recommend triple phase bone scan.

## 2015-12-10 ENCOUNTER — Telehealth: Payer: Self-pay | Admitting: Podiatry

## 2015-12-10 NOTE — Telephone Encounter (Signed)
Pt called wanting too know her results from her liver test?

## 2015-12-12 NOTE — Telephone Encounter (Signed)
GGT test is normal... Indicating that her liver is functioning fine. She needs to follow up with her PCP for further investigation to find out where her elevated Alk Phos is coming from. - Dr. Amalia Hailey

## 2015-12-12 NOTE — Telephone Encounter (Addendum)
Pt called for her last lab results. Informed pt of Dr. Logan BoresEvans review of results and recommendation to go to PCP. Pt states she sees Elbert Ewingsheryl Paulette Lindsey, FNP [facility].  Hessie KnowsI Googled the facility and [facility] resulted with [phone], fax [phone]. I confirmed with pt. Faxed labs.

## 2017-03-21 DIAGNOSIS — E559 Vitamin D deficiency, unspecified: Secondary | ICD-10-CM | POA: Diagnosis not present

## 2017-03-21 DIAGNOSIS — M25562 Pain in left knee: Secondary | ICD-10-CM | POA: Diagnosis not present

## 2017-04-04 DIAGNOSIS — E559 Vitamin D deficiency, unspecified: Secondary | ICD-10-CM | POA: Diagnosis not present

## 2017-04-04 DIAGNOSIS — R5383 Other fatigue: Secondary | ICD-10-CM | POA: Diagnosis not present

## 2017-04-04 DIAGNOSIS — E7800 Pure hypercholesterolemia, unspecified: Secondary | ICD-10-CM | POA: Diagnosis not present

## 2017-04-04 DIAGNOSIS — R7611 Nonspecific reaction to tuberculin skin test without active tuberculosis: Secondary | ICD-10-CM | POA: Diagnosis not present

## 2017-07-01 DIAGNOSIS — E119 Type 2 diabetes mellitus without complications: Secondary | ICD-10-CM | POA: Diagnosis not present

## 2017-07-01 DIAGNOSIS — E559 Vitamin D deficiency, unspecified: Secondary | ICD-10-CM | POA: Diagnosis not present

## 2017-07-01 DIAGNOSIS — E041 Nontoxic single thyroid nodule: Secondary | ICD-10-CM | POA: Diagnosis not present

## 2017-07-01 DIAGNOSIS — R7303 Prediabetes: Secondary | ICD-10-CM | POA: Diagnosis not present

## 2017-07-01 DIAGNOSIS — E7800 Pure hypercholesterolemia, unspecified: Secondary | ICD-10-CM | POA: Diagnosis not present

## 2018-02-06 DIAGNOSIS — J40 Bronchitis, not specified as acute or chronic: Secondary | ICD-10-CM | POA: Diagnosis not present

## 2018-04-04 ENCOUNTER — Ambulatory Visit: Payer: 59 | Admitting: Podiatry

## 2018-04-04 ENCOUNTER — Other Ambulatory Visit: Payer: Self-pay | Admitting: Podiatry

## 2018-04-04 ENCOUNTER — Encounter: Payer: Self-pay | Admitting: Podiatry

## 2018-04-04 ENCOUNTER — Ambulatory Visit (INDEPENDENT_AMBULATORY_CARE_PROVIDER_SITE_OTHER): Payer: 59

## 2018-04-04 DIAGNOSIS — M7661 Achilles tendinitis, right leg: Secondary | ICD-10-CM

## 2018-04-04 DIAGNOSIS — M7662 Achilles tendinitis, left leg: Secondary | ICD-10-CM

## 2018-04-04 DIAGNOSIS — B351 Tinea unguium: Secondary | ICD-10-CM | POA: Diagnosis not present

## 2018-04-04 DIAGNOSIS — M779 Enthesopathy, unspecified: Secondary | ICD-10-CM

## 2018-04-04 MED ORDER — TERBINAFINE HCL 250 MG PO TABS: 250.0000 mg | ORAL_TABLET | Freq: Every day | ORAL | 0 refills | Status: DC

## 2018-04-04 MED ORDER — MELOXICAM 15 MG PO TABS: 15.0000 mg | ORAL_TABLET | Freq: Every day | ORAL | 0 refills | Status: DC

## 2018-04-10 NOTE — Progress Notes (Signed)
   HPI: 48 year old female presenting today with a chief complaint of pain to the bilateral Achilles tendons that began about three weeks ago secondary to an injury. She states she fell when the pain began. Walking, wearing shoes and touching the areas increases the pain. She has been massaging the areas for treatment. Patient is here for further evaluation and treatment.   No past medical history on file.    Physical Exam: General: The patient is alert and oriented x3 in no acute distress.  Dermatology: Hyperkeratotic, discolored, thickened, onychodystrophy of nails 1-5 of bilateral feet. Skin is warm, dry and supple bilateral lower extremities. Negative for open lesions or macerations.  Vascular: Palpable pedal pulses bilaterally. No edema or erythema noted. Capillary refill within normal limits.  Neurological: Epicritic and protective threshold grossly intact bilaterally.   Musculoskeletal Exam: Pain on palpation noted to the posterior tubercle of the bilateral calcaneus at the insertion of the Achilles tendon consistent with retrocalcaneal bursitis. Range of motion within normal limits. Muscle strength 5/5 in all muscle groups bilateral lower extremities.  Radiographic Exam:  Posterior calcaneal spur noted to the respective calcaneus on lateral view. No fracture or dislocation noted. Normal osseous mineralization noted.     Assessment: 1. Insertional Achilles tendinitis bilateral  2. Retrocalcaneal bursitis 3. Onychomycosis nails 1-5 bilateral   Plan of Care:  1. Patient was evaluated. Radiographs were reviewed today. 2. Injection of 0.5 mL Celestone Soluspan injected into the retrocalcaneal bursa. Care was taken to avoid direct injection into the Achilles tendon. 3. Prescription for Meloxicam provided to patient. 4. Prescription for Lamisil 250 mg provided to patient.  5. Return to clinic as needed.    Felecia Shelling, DPM [facility]  Dr. Felecia Shelling, DPM    [address]                                        [place], [place] [address]                Office [phone]  Fax [phone]

## 2018-05-20 ENCOUNTER — Other Ambulatory Visit: Payer: Self-pay | Admitting: Podiatry

## 2018-07-19 ENCOUNTER — Other Ambulatory Visit: Payer: Self-pay | Admitting: Podiatry

## 2018-07-24 NOTE — Telephone Encounter (Signed)
Dr. Logan Bores, do you want to give another 90 day supply of Lamisil to this patient or does she need to come back in first?  Please advise

## 2018-07-26 NOTE — Telephone Encounter (Signed)
I don't need to see her back. Please explain to her that it takes 9-12 months for her nails to grow out. We can re-prescribe another round of lamisil in 3-6 months if she needs if the nails haven't improved by then.  - Dr. Logan Bores

## 2018-07-31 ENCOUNTER — Other Ambulatory Visit: Payer: Self-pay | Admitting: Podiatry

## 2018-08-11 ENCOUNTER — Encounter: Payer: Self-pay | Admitting: Podiatry

## 2018-08-11 ENCOUNTER — Ambulatory Visit: Payer: 59 | Admitting: Podiatry

## 2018-08-11 ENCOUNTER — Other Ambulatory Visit: Payer: Self-pay

## 2018-08-11 VITALS — Temp 97.0°F

## 2018-08-11 DIAGNOSIS — B351 Tinea unguium: Secondary | ICD-10-CM | POA: Diagnosis not present

## 2018-08-13 NOTE — Progress Notes (Signed)
   Subjective: 48 y.o. female presenting today for follow up evaluation of onychomycosis of nails 1-5 bilateral. She states the nails look significantly better at this time. She denies any new complaints or modifying factors. She has been taking the Lamisil as directed. Patient is here for further evaluation and treatment.   History reviewed. No pertinent past medical history.  Objective: Physical Exam General: The patient is alert and oriented x3 in no acute distress.  Dermatology: Hyperkeratotic, discolored, thickened, onychodystrophy of the left great toenail. Skin is warm, dry and supple bilateral lower extremities. Negative for open lesions or macerations.  Vascular: Palpable pedal pulses bilaterally. No edema or erythema noted. Capillary refill within normal limits.  Neurological: Epicritic and protective threshold grossly intact bilaterally.   Musculoskeletal Exam: Range of motion within normal limits to all pedal and ankle joints bilateral. Muscle strength 5/5 in all groups bilateral.   Assessment: #1 Onychomycosis left great toenail  #2 Hyperkeratotic nails left great toenail   Plan of Care:  #1 Patient was evaluated. #2 Mechanical debridement of left great toenail performed using a nail nipper. Filed with dremel without incident.  #3 Patient may call in the fall if she feels another round of Lamisil would be beneficial.  #4 Return to clinic as needed.     Edrick Kins, DPM [facility]  Dr. Edrick Kins, [address]                                        [place],  [address]                Office [phone]  Fax [phone]

## 2019-04-21 ENCOUNTER — Ambulatory Visit: Payer: 59

## 2019-04-21 ENCOUNTER — Ambulatory Visit: Payer: Self-pay | Attending: Internal Medicine

## 2019-04-21 DIAGNOSIS — Z23 Encounter for immunization: Secondary | ICD-10-CM | POA: Insufficient documentation

## 2019-04-21 NOTE — Progress Notes (Signed)
   Covid-19 Vaccination Clinic  Name:  JAMISON YUHASZ    MRN: [id number] DOB: [birth year]  04/21/2019  Ms. Veva Holes REYES was observed post Covid-19 immunization for 15 minutes without incidence. She was provided with Vaccine Information Sheet and instruction to access the V-Safe system.   Ms. Veva Holes REYES was instructed to call 911 with any severe reactions post vaccine: Marland Kitchen Difficulty breathing  . Swelling of your face and throat  . A fast heartbeat  . A bad rash all over your body  . Dizziness and weakness    Immunizations Administered    Name Date Dose VIS Date Route   Moderna COVID-19 Vaccine 04/21/2019  2:18 PM 0.5 mL 01/23/2019 Intramuscular   Manufacturer: Moderna   Lot: [id number]   NDC: 70230-172-09

## 2019-05-19 ENCOUNTER — Ambulatory Visit: Payer: Self-pay | Attending: Internal Medicine

## 2019-05-19 DIAGNOSIS — Z23 Encounter for immunization: Secondary | ICD-10-CM

## 2019-05-19 NOTE — Progress Notes (Signed)
   Covid-19 Vaccination Clinic  Name:  Amanda Ashley    MRN: [id number] DOB: 1970-04-17  05/19/2019  Ms. Amanda Ashley was observed post Covid-19 immunization for 15 minutes without incident. She was provided with Vaccine Information Sheet and instruction to access the V-Safe system.   Ms. Amanda Ashley was instructed to call 911 with any severe reactions post vaccine: Marland Kitchen Difficulty breathing  . Swelling of face and throat  . A fast heartbeat  . A bad rash all over body  . Dizziness and weakness   Immunizations Administered    Name Date Dose VIS Date Route   Moderna COVID-19 Vaccine 05/19/2019 10:35 AM 0.5 mL 01/23/2019 Intramuscular   Manufacturer: Gala Murdoch   Lot: [id number]   NDC: 25003-704-88

## 2019-07-17 ENCOUNTER — Other Ambulatory Visit: Payer: Self-pay | Admitting: Internal Medicine

## 2019-07-17 DIAGNOSIS — Z1231 Encounter for screening mammogram for malignant neoplasm of breast: Secondary | ICD-10-CM

## 2019-07-19 ENCOUNTER — Ambulatory Visit
Admission: RE | Admit: 2019-07-19 | Discharge: 2019-07-19 | Disposition: A | Discharge status: Home / Self Care | Payer: 59 | Source: Ambulatory Visit | Attending: Internal Medicine | Admitting: Internal Medicine

## 2019-07-19 DIAGNOSIS — Z1231 Encounter for screening mammogram for malignant neoplasm of breast: Secondary | ICD-10-CM

## 2019-07-24 ENCOUNTER — Other Ambulatory Visit: Payer: Self-pay | Admitting: Internal Medicine

## 2019-07-24 DIAGNOSIS — R928 Other abnormal and inconclusive findings on diagnostic imaging of breast: Secondary | ICD-10-CM

## 2019-07-24 DIAGNOSIS — N6489 Other specified disorders of breast: Secondary | ICD-10-CM

## 2019-07-26 ENCOUNTER — Ambulatory Visit
Admission: RE | Admit: 2019-07-26 | Discharge: 2019-07-26 | Disposition: A | Discharge status: Home / Self Care | Payer: 59 | Source: Ambulatory Visit | Attending: Internal Medicine | Admitting: Internal Medicine

## 2019-07-26 DIAGNOSIS — N6489 Other specified disorders of breast: Secondary | ICD-10-CM

## 2019-07-26 DIAGNOSIS — R928 Other abnormal and inconclusive findings on diagnostic imaging of breast: Secondary | ICD-10-CM

## 2020-08-18 ENCOUNTER — Encounter: Payer: Self-pay | Admitting: Emergency Medicine

## 2020-08-18 ENCOUNTER — Emergency Department
Admission: EM | Admit: 2020-08-18 | Discharge: 2020-08-18 | Disposition: A | Discharge status: Home / Self Care | Payer: 59 | Attending: Emergency Medicine | Admitting: Emergency Medicine

## 2020-08-18 ENCOUNTER — Other Ambulatory Visit: Payer: Self-pay

## 2020-08-18 DIAGNOSIS — F430 Acute stress reaction: Secondary | ICD-10-CM | POA: Insufficient documentation

## 2020-08-18 DIAGNOSIS — R0602 Shortness of breath: Secondary | ICD-10-CM | POA: Diagnosis present

## 2020-08-18 LAB — CBC
HCT: 36.9 % (ref 36.0–46.0)
Hemoglobin: 12.6 g/dL (ref 12.0–15.0)
MCH: 28.4 pg (ref 26.0–34.0)
MCHC: 34.1 g/dL (ref 30.0–36.0)
MCV: 83.1 fL (ref 80.0–100.0)
Platelets: 265 10*3/uL (ref 150–400)
RBC: 4.44 MIL/uL (ref 3.87–5.11)
RDW: 14 % (ref 11.5–15.5)
WBC: 10.6 10*3/uL — ABNORMAL HIGH (ref 4.0–10.5)
nRBC: 0 % (ref 0.0–0.2)

## 2020-08-18 LAB — BASIC METABOLIC PANEL
Anion gap: 7 (ref 5–15)
BUN: 12 mg/dL (ref 6–20)
CO2: 23 mmol/L (ref 22–32)
Calcium: 9.1 mg/dL (ref 8.9–10.3)
Chloride: 106 mmol/L (ref 98–111)
Creatinine, Ser: 0.73 mg/dL (ref 0.44–1.00)
GFR, Estimated: >60 mL/min (ref >60)
Glucose, Bld: 120 mg/dL — ABNORMAL HIGH (ref 70–99)
Potassium: 3.8 mmol/L (ref 3.5–5.1)
Sodium: 136 mmol/L (ref 135–145)

## 2020-08-18 MED ORDER — DIAZEPAM 5 MG PO TABS
5.0000 mg | ORAL_TABLET | Freq: Three times a day (TID) | ORAL | 0 refills | Status: DC | PRN
Start: 2020-08-18 — End: 2023-11-16

## 2020-08-18 NOTE — ED Provider Notes (Signed)
[facility] Emergency Department Provider Note  ____________________________________________  Time seen: Approximately 11:34 AM  I have reviewed the triage vital signs and the nursing notes.   HISTORY  Chief Complaint Panic Attack    HPI Amanda Ashley is a 50 y.o. female with no significant past medical history who comes ED complaining of severe emotional upset which caused her to feel short of breath and have chest tightness.  Started this morning after an argument with her son in which she confronted him for not adhering to their family cultural and religious [employer] values.  She became very upset and and started having the symptoms.  No cough or fever.  No pleuritic pain or exertional symptoms.  Denies history of DVT or PE.  No recent travel trauma hospitalization or surgery.  She reports that her symptoms lasted about 20 minutes and then resolved when she got to the emergency room.  She is initially calm with normal vital signs, and when asked about the incident with her son she becomes quite upset very quickly again.    History reviewed. No pertinent past medical history.   There are no problems to display for this patient.    History reviewed. No pertinent surgical history.   Prior to Admission medications   Medication Sig Start Date End Date Taking? Authorizing Provider  diazepam (VALIUM) 5 MG tablet Take 1 tablet (5 mg total) by mouth every 8 (eight) hours as needed for anxiety. 08/18/20  Yes Sharman Cheek, MD  meloxicam (MOBIC) 15 MG tablet TAKE 1 TABLET(15 MG) BY MOUTH DAILY 05/22/18   Felecia Shelling, DPM  terbinafine (LAMISIL) 250 MG tablet Take 1 tablet (250 mg total) by mouth daily. 04/04/18   Felecia Shelling, DPM     Allergies Patient has no known allergies.   Family History  Problem Relation Age of Onset   Breast cancer Maternal Aunt     Social History Social History   Tobacco Use   Smoking status: Never    Smokeless tobacco: Never  Substance Use Topics   Alcohol use: No   Drug use: No    Review of Systems  Constitutional:   No fever or chills.  ENT:   No sore throat. No rhinorrhea. Cardiovascular:   Positive chest tightness as above without syncope. Respiratory:   Positive shortness of breath without cough. Gastrointestinal:   Negative for abdominal pain, vomiting and diarrhea.  Musculoskeletal:   Negative for focal pain or swelling All other systems reviewed and are negative except as documented above in ROS and HPI.  ____________________________________________   PHYSICAL EXAM:  VITAL SIGNS: ED Triage Vitals  Enc Vitals Group     BP 08/18/20 0846 (!) 157/101     Pulse Rate 08/18/20 0846 (!) 104     Resp 08/18/20 0846 (!) 22     Temp 08/18/20 0846 98 F (36.7 C)     Temp Source 08/18/20 0846 Oral     SpO2 08/18/20 0846 100 %     Weight 08/18/20 0847 223 lb 15.8 oz (101.6 kg)     Height 08/18/20 0847 5\' 5"  (1.651 m)     Head Circumference --      Peak Flow --      Pain Score 08/18/20 0846 6     Pain Loc --      Pain Edu? --      Excl. in GC? --     Vital signs reviewed, nursing assessments reviewed.  Constitutional:   Alert and oriented. Non-toxic appearance. Eyes:   Conjunctivae are normal. EOMI. PERRL. ENT      Head:   Normocephalic and atraumatic.      Nose:   Wearing a mask.      Mouth/Throat:   Wearing a mask.      Neck:   No meningismus. Full ROM. Hematological/Lymphatic/Immunilogical:   No cervical lymphadenopathy. Cardiovascular:   RRR. Symmetric bilateral radial and DP pulses.  No murmurs. Cap refill less than 2 seconds. Respiratory:   Normal respiratory effort without tachypnea/retractions. Breath sounds are clear and equal bilaterally. No wheezes/rales/rhonchi. Gastrointestinal:   Soft and nontender. Non distended. There is no CVA tenderness.  No rebound, rigidity, or guarding. Genitourinary:   deferred Musculoskeletal:   Normal range of motion in all  extremities. No joint effusions.  No lower extremity tenderness.  No edema.  Negative Homans' sign.  Symmetric calf circumference Neurologic:   Normal speech and language.  Motor grossly intact. No acute focal neurologic deficits are appreciated.  Skin:    Skin is warm, dry and intact. No rash noted.  No petechiae, purpura, or bullae.  ____________________________________________    LABS (pertinent positives/negatives) (all labs ordered are listed, but only abnormal results are displayed) Labs Reviewed  BASIC METABOLIC PANEL - Abnormal; Notable for the following components:      Result Value   Glucose, Bld 120 (*)    All other components within normal limits  CBC - Abnormal; Notable for the following components:   WBC 10.6 (*)    All other components within normal limits  POC URINE PREG, ED   ____________________________________________   EKG  Interpreted by me  Date: 08/18/2020  Rate: 98  Rhythm: normal sinus rhythm  QRS Axis: normal  Intervals: normal  ST/T Wave abnormalities: normal  Conduction Disutrbances: none  Narrative Interpretation: unremarkable     ____________________________________________    RADIOLOGY  No results found.  ____________________________________________   PROCEDURES Procedures  ____________________________________________    CLINICAL IMPRESSION / ASSESSMENT AND PLAN / ED COURSE  Medications ordered in the ED: Medications - No data to display  Pertinent labs & imaging results that were available during my care of the patient were reviewed by me and considered in my medical decision making (see chart for details).  Amanda Ashley was evaluated in Emergency Department on 08/18/2020 for the symptoms described in the history of present illness. She was evaluated in the context of the global COVID-19 pandemic, which necessitated consideration that the patient might be at risk for infection with the SARS-CoV-2 virus that causes  COVID-19. Institutional protocols and algorithms that pertain to the evaluation of patients at risk for COVID-19 are in a state of rapid change based on information released by regulatory bodies including the CDC and federal and state organizations. These policies and algorithms were followed during the patient's care in the ED.   Patient presents with typical anxiety attack/stress reaction to emotionally upsetting situation with her son. Considering the patient's symptoms, medical history, and physical examination today, I have low suspicion for ACS, PE, TAD, pneumothorax, carditis, mediastinitis, pneumonia, CHF, or sepsis. Vitals and EKG are normal.  Basic labs are normal, exam is normal.  Very reassuring evaluation today, will send low-dose Valium to her pharmacy, recommend she follow-up with PCP for counseling referral.      ____________________________________________   FINAL CLINICAL IMPRESSION(S) / ED DIAGNOSES    Final diagnoses:  Stress reaction     ED Discharge Orders  Ordered    diazepam (VALIUM) 5 MG tablet  Every 8 hours PRN        08/18/20 1134            Portions of this note were generated with dragon dictation software. Dictation errors may occur despite best attempts at proofreading.   Sharman Cheek, MD 08/18/20 [phone]

## 2020-08-18 NOTE — ED Notes (Signed)
Interpreter requested 

## 2020-08-18 NOTE — ED Triage Notes (Addendum)
C/O high home stress with son. States this morning feeling very anxious and having trouble breathing.  Patient tearful in triage.    Patient currently being treated for pneumonia.  AAOx3.  Skin warm and dry. NAD

## 2021-03-20 IMAGING — US US BREAST*R* LIMITED INC AXILLA
1 series · 10 of 10 positions shown · non-contrast
Comparison: 07/19/2019 and earlier

CLINICAL DATA: Patient returns after screening study for evaluation
of possible RIGHT breast asymmetry.

EXAM:
DIGITAL DIAGNOSTIC RIGHT MAMMOGRAM WITH CAD AND TOMO
ULTRASOUND RIGHT BREAST

[Series 1: us breast*right* limited inc axilla · 0.08mm/px · 10 of 10 slices shown]
[im 1/10]
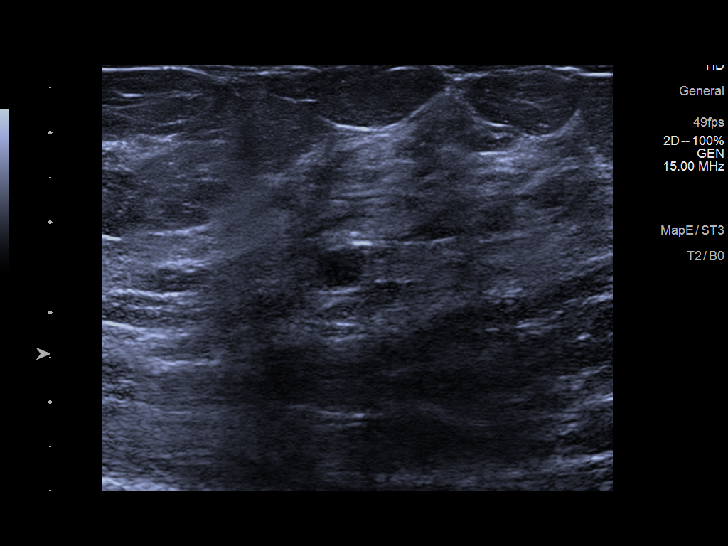
[im 2/10]
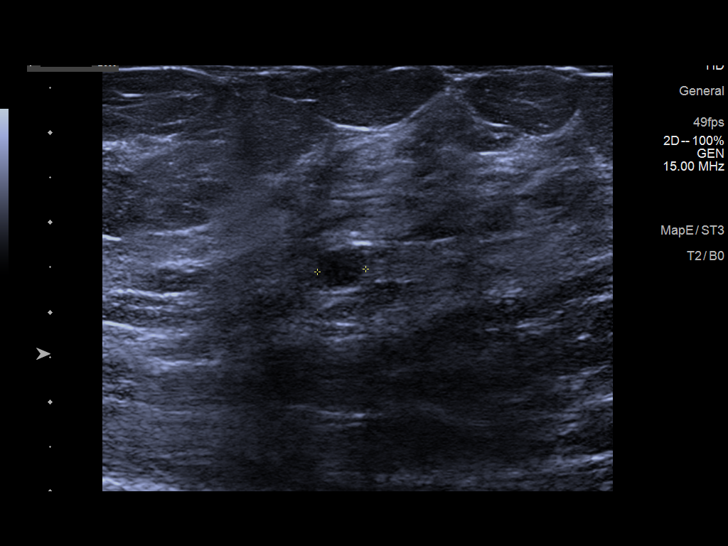
[im 3/10]
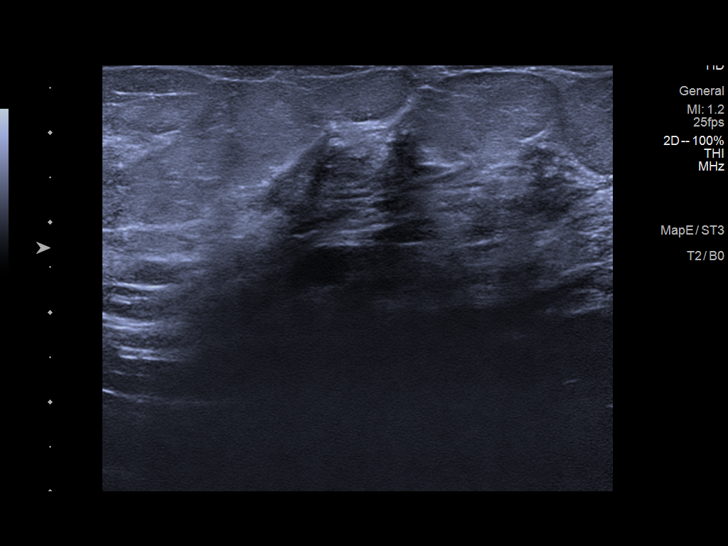
[im 4/10]
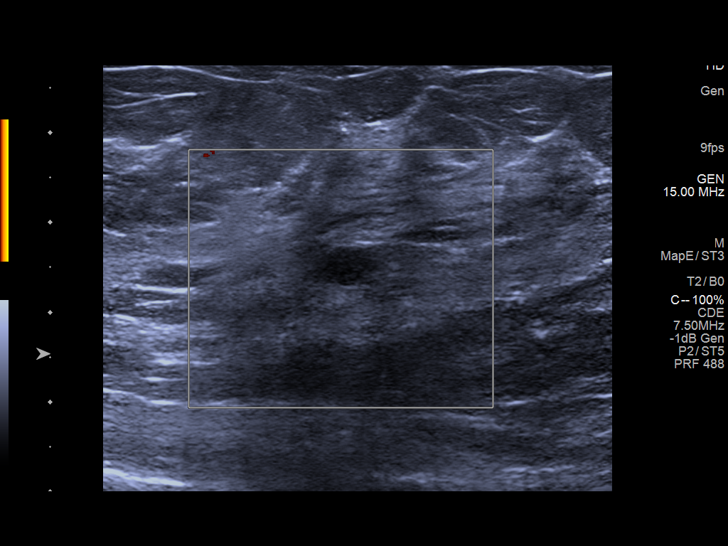
[im 5/10]
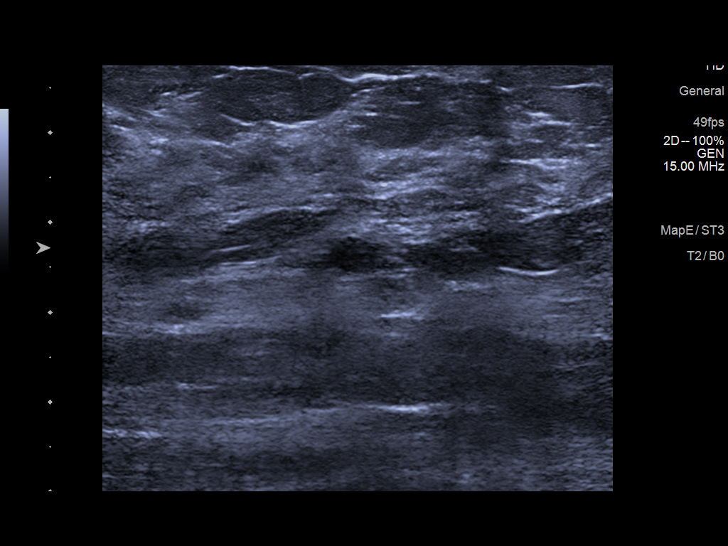
[im 6/10]
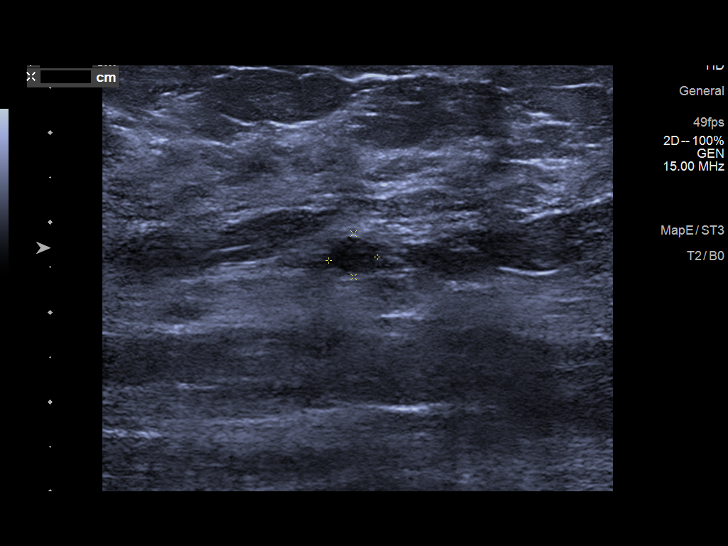
[im 7/10]
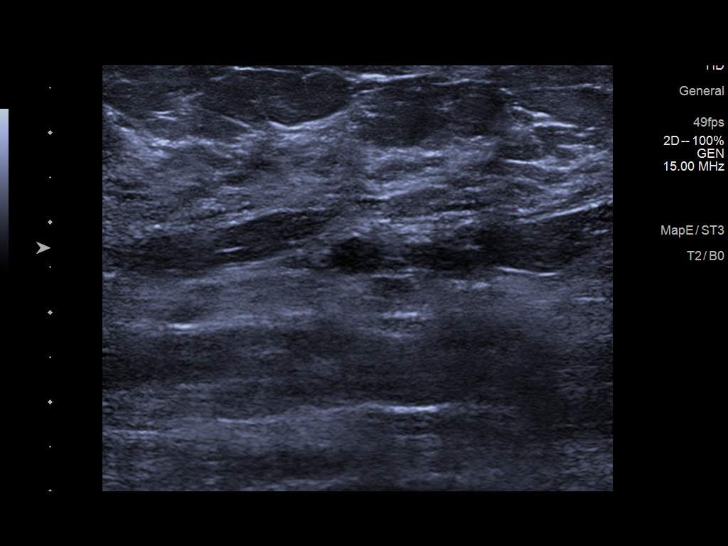
[im 8/10]
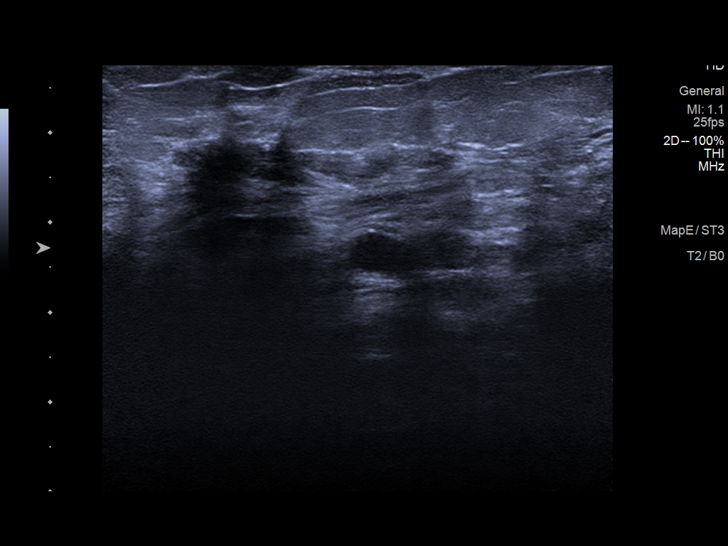
[im 9/10]
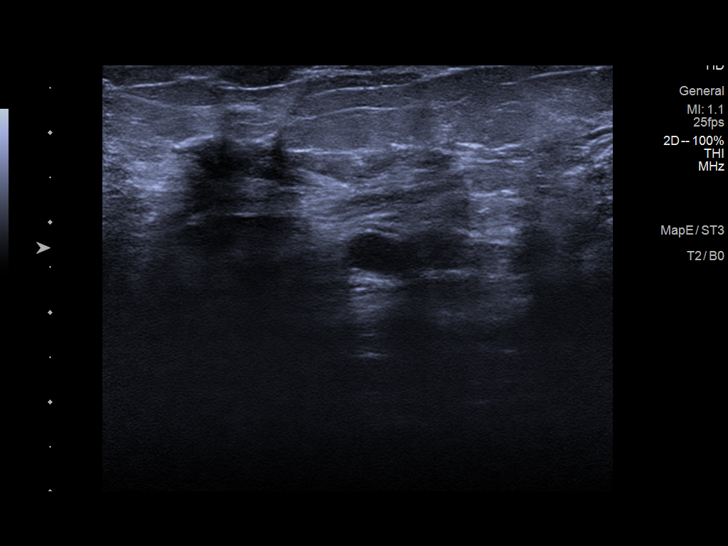
[im 10/10]
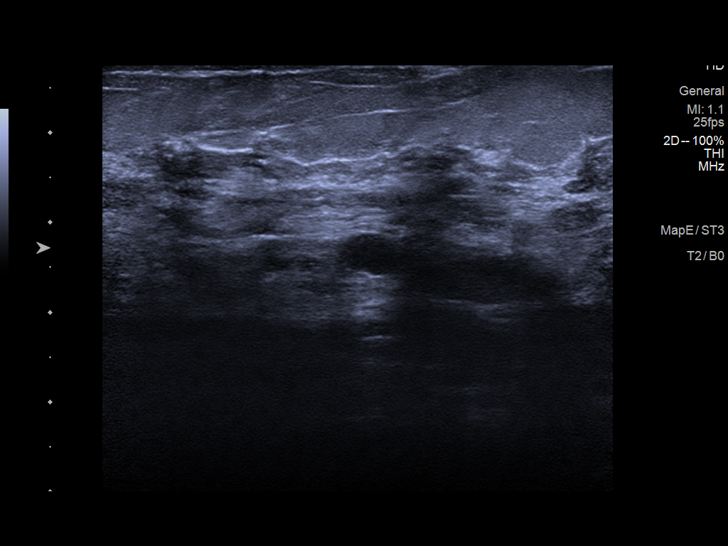

[10 of 10 positions shown; findings below may reference images not displayed]

ACR Breast Density Category c: The breast tissue is heterogeneously
dense, which may obscure small masses.
FINDINGS: Additional 2-D and 3-D images are performed. These views show
extremely dense fibroglandular tissue and possible asymmetry versus
mass just LATERAL to the RIGHT nipple.

Mammographic images were processed with CAD.

Targeted ultrasound is performed, showing dense fibroglandular
tissue in the LATERAL portions of the RIGHT breast. Note is made of
a benign cyst in the 9 o'clock location 5 centimeters from the
nipple which measures 0.5 x 0.5 x 0.7 centimeters.
IMPRESSION: No mammographic or ultrasound evidence for malignancy.

RECOMMENDATION:
Insert screen

I have discussed the findings and recommendations with the patient.
If applicable, a reminder letter will be sent to the patient
regarding the next appointment.

BI-RADS CATEGORY  2: Benign.

## 2021-08-14 ENCOUNTER — Ambulatory Visit (INDEPENDENT_AMBULATORY_CARE_PROVIDER_SITE_OTHER): Payer: 59

## 2021-08-14 ENCOUNTER — Ambulatory Visit: Payer: 59 | Admitting: Podiatry

## 2021-08-14 DIAGNOSIS — M7661 Achilles tendinitis, right leg: Secondary | ICD-10-CM

## 2021-08-14 DIAGNOSIS — M7662 Achilles tendinitis, left leg: Secondary | ICD-10-CM | POA: Diagnosis not present

## 2021-08-14 MED ORDER — METHYLPREDNISOLONE 4 MG PO TBPK: ORAL_TABLET | ORAL | 0 refills | Status: DC

## 2021-08-14 MED ORDER — MELOXICAM 15 MG PO TABS: ORAL_TABLET | ORAL | 1 refills | Status: DC

## 2021-08-14 MED ORDER — BETAMETHASONE SOD PHOS & ACET 6 (3-3) MG/ML IJ SUSP
3.0000 mg | Freq: Once | INTRAMUSCULAR | Status: AC
  Administered 2021-08-14: 3 mg via INTRA_ARTICULAR

## 2021-10-09 ENCOUNTER — Ambulatory Visit: Payer: 59 | Admitting: Podiatry

## 2021-10-09 DIAGNOSIS — M7662 Achilles tendinitis, left leg: Secondary | ICD-10-CM | POA: Diagnosis not present

## 2021-10-09 DIAGNOSIS — M7661 Achilles tendinitis, right leg: Secondary | ICD-10-CM

## 2021-10-09 NOTE — Progress Notes (Signed)
   Chief Complaint  Patient presents with   Foot Pain    Patient is here for follow-up.    HPI: 51 y.o. female presenting today for follow-up evaluation of posterior heel pain to the bilateral feet.  She says that her left hurts more than her right.  This has been ongoing for several years, since before COVID.    Patient states that the injections and Medrol Dosepak did help however she continues to have some pain and tenderness.  She has been wearing good shoes and stretching daily.  No new complaints at this time  No past medical history on file.  No past surgical history on file.  No Known Allergies   Physical Exam: General: The patient is alert and oriented x3 in no acute distress.  Dermatology: Skin is warm, dry and supple bilateral lower extremities. Negative for open lesions or macerations.  Vascular: Palpable pedal pulses bilaterally. No edema or erythema noted. Capillary refill within normal limits.  Neurological: Epicritic and protective threshold grossly intact bilaterally.   Musculoskeletal Exam: There continues to be some pain on palpation noted to the posterior tubercle of the bilateral calcaneus at the insertion of the Achilles tendon consistent with retrocalcaneal bursitis. Range of motion within normal limits. Muscle strength 5/5 in all muscle groups bilateral lower extremities.  Radiographic Exam B/L feet 08/14/2021:  Posterior calcaneal spur noted to the respective calcaneus on lateral view. No fracture or dislocation noted. Normal osseous mineralization noted.     Assessment: 1. Insertional Achilles tendinitis bilateral 2.  Posterior heel spur bilateral  Plan of Care:  1. Patient was evaluated.  2.  Continue meloxicam 15 mg daily 3.  Prescription provided for the patient for physical therapy to take to [facility] 4.  Continue daily range of motion exercises and stretching of the posterior Achilles tendon and calf 5.  Continue wearing good supportive shoes.   Advised against going barefoot 6.  Return to clinic as needed  *Presents today with her daughter, Pattricia Boss (63yrs).  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 Felecia Shelling, DPM    [address] [place], [place] [address]                Office [phone]  Fax [phone]

## 2022-02-01 ENCOUNTER — Encounter: Payer: Self-pay | Admitting: Emergency Medicine

## 2022-02-01 ENCOUNTER — Emergency Department: Payer: 59

## 2022-02-01 ENCOUNTER — Other Ambulatory Visit: Payer: Self-pay

## 2022-02-01 DIAGNOSIS — R1011 Right upper quadrant pain: Secondary | ICD-10-CM | POA: Diagnosis present

## 2022-02-01 DIAGNOSIS — K8000 Calculus of gallbladder with acute cholecystitis without obstruction: Secondary | ICD-10-CM | POA: Diagnosis not present

## 2022-02-01 LAB — CBC
HCT: 38 % (ref 36.0–46.0)
Hemoglobin: 12.5 g/dL (ref 12.0–15.0)
MCH: 28.6 pg (ref 26.0–34.0)
MCHC: 32.9 g/dL (ref 30.0–36.0)
MCV: 87 fL (ref 80.0–100.0)
Platelets: 266 10*3/uL (ref 150–400)
RBC: 4.37 MIL/uL (ref 3.87–5.11)
RDW: 13.2 % (ref 11.5–15.5)
WBC: 10.5 10*3/uL (ref 4.0–10.5)
nRBC: 0 % (ref 0.0–0.2)

## 2022-02-01 LAB — COMPREHENSIVE METABOLIC PANEL
ALT: 16 U/L (ref 0–44)
AST: 18 U/L (ref 15–41)
Albumin: 3.9 g/dL (ref 3.5–5.0)
Alkaline Phosphatase: 187 U/L — ABNORMAL HIGH (ref 38–126)
Anion gap: 8 (ref 5–15)
BUN: 12 mg/dL (ref 6–20)
CO2: 25 mmol/L (ref 22–32)
Calcium: 9 mg/dL (ref 8.9–10.3)
Chloride: 106 mmol/L (ref 98–111)
Creatinine, Ser: 0.72 mg/dL (ref 0.44–1.00)
GFR, Estimated: >60 mL/min (ref >60)
Glucose, Bld: 130 mg/dL — ABNORMAL HIGH (ref 70–99)
Potassium: 3.8 mmol/L (ref 3.5–5.1)
Sodium: 139 mmol/L (ref 135–145)
Total Bilirubin: 0.3 mg/dL (ref 0.3–1.2)
Total Protein: 7.8 g/dL (ref 6.5–8.1)

## 2022-02-01 LAB — URINALYSIS, ROUTINE W REFLEX MICROSCOPIC
Bacteria, UA: NONE SEEN
Bilirubin Urine: NEGATIVE
Glucose, UA: NEGATIVE mg/dL
Ketones, ur: 5 mg/dL — AB
Leukocytes,Ua: NEGATIVE
Nitrite: NEGATIVE
Protein, ur: 100 mg/dL — AB
Specific Gravity, Urine: 1.029 (ref 1.005–1.030)
pH: 5 (ref 5.0–8.0)

## 2022-02-01 LAB — LIPASE, BLOOD: Lipase: 39 U/L (ref 11–51)

## 2022-02-01 LAB — POC URINE PREG, ED: Preg Test, Ur: NEGATIVE

## 2022-02-01 NOTE — ED Triage Notes (Signed)
Pt to ED from home c/o upper mid abd pain since yesterday getting worse today.  Pain is stabbing, hx of acid reflux but feels different.  Nausea with vomiting x1 today.  Denies urinary changes.  Pt A&Ox4, chest rise even and unlabored, skin WNL and in NAD at this time.

## 2022-02-02 ENCOUNTER — Observation Stay
Admission: EM | Admit: 2022-02-02 | Discharge: 2022-02-04 | Disposition: A | Discharge status: Home / Self Care | Payer: 59 | Attending: Surgery | Admitting: Surgery

## 2022-02-02 DIAGNOSIS — K8020 Calculus of gallbladder without cholecystitis without obstruction: Secondary | ICD-10-CM

## 2022-02-02 DIAGNOSIS — K810 Acute cholecystitis: Secondary | ICD-10-CM | POA: Diagnosis present

## 2022-02-02 DIAGNOSIS — K8000 Calculus of gallbladder with acute cholecystitis without obstruction: Secondary | ICD-10-CM | POA: Diagnosis present

## 2022-02-02 HISTORY — DX: Gastro-esophageal reflux disease without esophagitis: K21.9

## 2022-02-02 HISTORY — DX: Cardiac arrhythmia, unspecified: I49.9

## 2022-02-02 MED ORDER — METRONIDAZOLE 500 MG/100ML IV SOLN
500.0000 mg | Freq: Once | INTRAVENOUS | Status: AC
  Administered 2022-02-02: 500 mg via INTRAVENOUS
  Filled 2022-02-02: qty 100

## 2022-02-02 MED ORDER — ONDANSETRON HCL 4 MG/2ML IJ SOLN
4.0000 mg | Freq: Once | INTRAMUSCULAR | Status: AC
  Administered 2022-02-02: 4 mg via INTRAVENOUS
  Filled 2022-02-02: qty 2

## 2022-02-02 MED ORDER — ONDANSETRON HCL 4 MG/2ML IJ SOLN
4.0000 mg | Freq: Four times a day (QID) | INTRAMUSCULAR | Status: DC | PRN
  Administered 2022-02-03: 4 mg via INTRAVENOUS
  Filled 2022-02-02: qty 2

## 2022-02-02 MED ORDER — ONDANSETRON 4 MG PO TBDP: 4.0000 mg | ORAL_TABLET | Freq: Four times a day (QID) | ORAL | Status: DC | PRN

## 2022-02-02 MED ORDER — OXYCODONE HCL 5 MG PO TABS: 5.0000 mg | ORAL_TABLET | ORAL | Status: DC | PRN

## 2022-02-02 MED ORDER — METRONIDAZOLE 500 MG/100ML IV SOLN
500.0000 mg | Freq: Two times a day (BID) | INTRAVENOUS | Status: DC
  Administered 2022-02-02 – 2022-02-04 (×4): 500 mg via INTRAVENOUS
  Filled 2022-02-02 (×5): qty 100

## 2022-02-02 MED ORDER — ACETAMINOPHEN 500 MG PO TABS
1000.0000 mg | ORAL_TABLET | Freq: Four times a day (QID) | ORAL | Status: DC
  Administered 2022-02-02 – 2022-02-04 (×6): 1000 mg via ORAL
  Filled 2022-02-02 (×4): qty 2

## 2022-02-02 MED ORDER — MORPHINE SULFATE (PF) 4 MG/ML IV SOLN
4.0000 mg | Freq: Once | INTRAVENOUS | Status: AC
  Administered 2022-02-02: 4 mg via INTRAVENOUS
  Filled 2022-02-02: qty 1

## 2022-02-02 MED ORDER — SODIUM CHLORIDE 0.9 % IV SOLN
INTRAVENOUS | Status: DC
  Administered 2022-02-02: 100 mL/h via INTRAVENOUS

## 2022-02-02 MED ORDER — PANTOPRAZOLE SODIUM 40 MG IV SOLR
40.0000 mg | Freq: Two times a day (BID) | INTRAVENOUS | Status: DC
  Administered 2022-02-02 – 2022-02-04 (×5): 40 mg via INTRAVENOUS
  Filled 2022-02-02 (×3): qty 10

## 2022-02-02 MED ORDER — ACETAMINOPHEN 500 MG PO TABS
1000.0000 mg | ORAL_TABLET | Freq: Once | ORAL | Status: AC
  Administered 2022-02-02: 1000 mg via ORAL
  Filled 2022-02-02: qty 2

## 2022-02-02 MED ORDER — SODIUM CHLORIDE 0.9 % IV SOLN
1.0000 g | Freq: Once | INTRAVENOUS | Status: AC
  Administered 2022-02-02: 1 g via INTRAVENOUS
  Filled 2022-02-02: qty 10

## 2022-02-02 MED ORDER — INDOCYANINE GREEN 25 MG IV SOLR
2.5000 mg | INTRAVENOUS | Status: AC
  Filled 2022-02-02 (×2): qty 10

## 2022-02-02 MED ORDER — CIPROFLOXACIN IN D5W 400 MG/200ML IV SOLN
400.0000 mg | Freq: Two times a day (BID) | INTRAVENOUS | Status: DC
  Administered 2022-02-02 – 2022-02-04 (×4): 400 mg via INTRAVENOUS
  Filled 2022-02-02 (×3): qty 200

## 2022-02-02 MED ORDER — HYDROMORPHONE HCL 1 MG/ML IJ SOLN
0.5000 mg | INTRAMUSCULAR | Status: DC | PRN
  Administered 2022-02-03 (×2): 0.5 mg via INTRAVENOUS
  Filled 2022-02-02 (×2): qty 0.5

## 2022-02-02 NOTE — ED Provider Notes (Addendum)
[facility] Provider Note    Event Date/Time   First MD Initiated Contact with Patient 02/02/22 [phone]     (approximate)   History   Abdominal Pain   HPI  Amanda Ashley is a 51 y.o. female   Past medical history of no significant past medical history presents with right upper quadrant pain that has been bothering her for the last several days.  Worse after eating big meals.  She has had some nausea and vomiting as well.  No changes in bowel movements, no urinary symptoms.  No fever or chills.    History was obtained via the patient.  Her daughter is available as an independent historian corroborates information as given above.      Physical Exam   Triage Vital Signs: ED Triage Vitals  Enc Vitals Group     BP 02/01/22 2018 (!) 160/89     Pulse Rate 02/01/22 2018 99     Resp 02/01/22 2018 18     Temp 02/01/22 2018 98.7 F (37.1 C)     Temp Source 02/01/22 2018 Oral     SpO2 02/01/22 2018 95 %     Weight 02/01/22 2014 280 lb (127 kg)     Height 02/01/22 2014 5\' 5"  (1.651 m)     Head Circumference --      Peak Flow --      Pain Score 02/01/22 2013 10     Pain Loc --      Pain Edu? --      Excl. in GC? --     Most recent vital signs: Vitals:   02/02/22 0536 02/02/22 0549  BP: (!) 175/97   Pulse: (!) 111   Resp: 20   Temp:  98.3 F (36.8 C)  SpO2: 98%     General: Awake, no distress.  CV:  Good peripheral perfusion.  Resp:  Normal effort.  Abd:  No distention.  Right upper quadrant tenderness Other:  No fever.   ED Results / Procedures / Treatments   Labs (all labs ordered are listed, but only abnormal results are displayed) Labs Reviewed  COMPREHENSIVE METABOLIC PANEL - Abnormal; Notable for the following components:      Result Value   Glucose, Bld 130 (*)    Alkaline Phosphatase 187 (*)    All other components within normal limits  URINALYSIS, ROUTINE W REFLEX MICROSCOPIC - Abnormal; Notable for the following  components:   Color, Urine YELLOW (*)    APPearance TURBID (*)    Hgb urine dipstick MODERATE (*)    Ketones, ur 5 (*)    Protein, ur 100 (*)    All other components within normal limits  LIPASE, BLOOD  CBC  POC URINE PREG, ED     I reviewed labs and they are notable for normal LFTs, no leukocytosis  EKG  ED ECG REPORT I, 14/12/23, the attending physician, personally viewed and interpreted this ECG.   Date: 02/02/2022  EKG Time: 2024  Rate: 100  Rhythm: sinus tachycardia  Axis: nl  Intervals:none  ST&T Change: No acute ischemic changes    RADIOLOGY I independently reviewed and interpreted right upper quadrant ultrasound and see a stone in the neck.   PROCEDURES:  Critical Care performed: No  Procedures   MEDICATIONS ORDERED IN ED: Medications  acetaminophen (TYLENOL) tablet 1,000 mg (has no administration in time range)  morphine (PF) 4 MG/ML injection 4 mg (has no administration in time range)  ondansetron (ZOFRAN) injection 4 mg (has no administration in time range)     IMPRESSION / MDM / ASSESSMENT AND PLAN / ED COURSE  I reviewed the triage vital signs and the nursing notes.                              Differential diagnosis includes, but is not limited to, cholecystitis, biliary colic, GERD/gastritis/ulcer, appendicitis, obstruction   MDM: This is a patient with a non- mobile 3.3 cm stone in the neck with no overt signs of cholecystitis on imaging and a normal white blood cell count and no fever.  Unfortunately she is quite tender to palpation and quite symptomatic over the last several days not being able to tolerate p.o. intake.  Will continue to administer pain medications in the emergency department, including IV morphine and IV Zofran.  I consulted with Dr. Everlene Farrier general surgery to discuss potential surgical intervention  Patient's presentation is most consistent with acute presentation with potential threat to life or bodily function.        FINAL CLINICAL IMPRESSION(S) / ED DIAGNOSES   Final diagnoses:  Symptomatic cholelithiasis     Rx / DC Orders   ED Discharge Orders     None        Note:  This document was prepared using Dragon voice recognition software and may include unintentional dictation errors.    Pilar Jarvis, MD 02/02/22 [id number]    Pilar Jarvis, MD 02/02/22 0254    Pilar Jarvis, MD 02/02/22 [phone]

## 2022-02-02 NOTE — ED Notes (Signed)
NPO status implemented at this time.

## 2022-02-02 NOTE — H&P (Signed)
[facility] SURGICAL HISTORY & PHYSICAL (cpt [phone])  HISTORY OF PRESENT ILLNESS (HPI):  51 y.o. female presented to [facility] ED overnight for abdominal pain. Patient reports the acute onset of upper abdominal discomfort over the course of the last 3 days. This was crampy in nature. Became more severe overnight with accompanying nausea and emesis which prompted presentation to the ED. She denied any fever, chills, cough, CP, SOB, bowel changes, or juandice. She does not believe she has a history of similar in the past but does endorsed reflux. Previous intra-abdominal surgeries include oophorectomy an tubal ligation. Work up in the ED revealed a normal WBC at 10.5K, renal function normal with sCr - 0.72, no electrolyte derangements, LFTs and bilirubin in normal ranges. RUQ US showed cholelithiasis in neck of the GB without significant evidence of cholecystitis.   General surgery is consulted by emergency medicine physician Dr Pilar Jarvis, MD for evaluation and management of symptomatic cholelithiasis.   PAST MEDICAL HISTORY (PMH):  Past Medical History:  Diagnosis Date   Acid reflux    Arrhythmia     Reviewed. Otherwise negative.   PAST SURGICAL HISTORY (PSH):  Past Surgical History:  Procedure Laterality Date   OOPHORECTOMY Right    TUBAL LIGATION      Reviewed. Otherwise negative.   MEDICATIONS:  Prior to Admission medications   Medication Sig Start Date End Date Taking? Authorizing Provider  diazepam (VALIUM) 5 MG tablet Take 1 tablet (5 mg total) by mouth every 8 (eight) hours as needed for anxiety. 08/18/20   Sharman Cheek, MD  meloxicam (MOBIC) 15 MG tablet TAKE 1 TABLET(15 MG) BY MOUTH DAILY 08/14/21   Felecia Shelling, DPM  methylPREDNISolone (MEDROL DOSEPAK) 4 MG TBPK tablet 6 day dose pack - take as directed 08/14/21   Felecia Shelling, DPM  terbinafine (LAMISIL) 250 MG tablet Take 1 tablet (250 mg total) by mouth daily. 04/04/18   Felecia Shelling, DPM     ALLERGIES:   No Known Allergies   SOCIAL HISTORY:  Social History   Socioeconomic History   Marital status: Married    Spouse name: Not on file   Number of children: Not on file   Years of education: Not on file   Highest education level: Not on file  Occupational History   Not on file  Tobacco Use   Smoking status: Never   Smokeless tobacco: Never  Substance and Sexual Activity   Alcohol use: No   Drug use: No   Sexual activity: Not on file  Other Topics Concern   Not on file  Social History Narrative   Not on file   Social Determinants of Health   Financial Resource Strain: Not on file  Food Insecurity: Not on file  Transportation Needs: Not on file  Physical Activity: Not on file  Stress: Not on file  Social Connections: Not on file  Intimate Partner Violence: Not on file     FAMILY HISTORY:  Family History  Problem Relation Age of Onset   Breast cancer Maternal Aunt     Otherwise negative.   REVIEW OF SYSTEMS:  Review of Systems  Constitutional:  Negative for chills and fever.  Respiratory:  Negative for cough and shortness of breath.   Cardiovascular:  Negative for chest pain and palpitations.  Gastrointestinal:  Positive for abdominal pain, nausea and vomiting. Negative for blood in stool, constipation and diarrhea.  Genitourinary:  Negative for dysuria and urgency.  All other systems reviewed and are  negative.   VITAL SIGNS:  Temp:  [98.3 F (36.8 C)-98.7 F (37.1 C)] 98.3 F (36.8 C) (12/12 0549) Pulse Rate:  [99-111] 111 (12/12 0536) Resp:  [18-20] 20 (12/12 0536) BP: (160-175)/(89-97) 175/97 (12/12 0536) SpO2:  [95 %-99 %] 98 % (12/12 0536) Weight:  [505 kg] 127 kg (12/11 2014)     Height: 5\' 5"  (165.1 cm) Weight: 127 kg BMI (Calculated): 46.59   PHYSICAL EXAM:  Physical Exam Vitals and nursing note reviewed. Exam conducted with a chaperone present.  Constitutional:      General: She is not in acute distress.    Appearance: She is well-developed.  She is obese. She is not ill-appearing.     Comments: Patient resting in bed; NAD. Daughter at bedside  HENT:     Head: Normocephalic and atraumatic.  Eyes:     General: No scleral icterus.    Extraocular Movements: Extraocular movements intact.  Cardiovascular:     Rate and Rhythm: Normal rate.     Heart sounds: Normal heart sounds. No murmur heard. Pulmonary:     Effort: Pulmonary effort is normal. No respiratory distress.  Abdominal:     General: Abdomen is flat. There is no distension.     Palpations: Abdomen is soft.     Tenderness: There is abdominal tenderness in the right upper quadrant and epigastric area. There is no guarding or rebound. Positive signs include Murphy's sign.     Comments: Abdomen is soft, she is tender in the epigastrium and RUQ with positive Murphy's Sign, non-distended, no evidence of overt peritonitis   Genitourinary:    Comments: Deferred Skin:    General: Skin is warm and dry.     Coloration: Skin is not jaundiced or pale.  Neurological:     General: No focal deficit present.     Mental Status: She is alert and oriented to person, place, and time.  Psychiatric:        Mood and Affect: Mood normal.        Behavior: Behavior normal.     INTAKE/OUTPUT:  This shift: No intake/output data recorded.  Last 2 shifts: @IOLAST2SHIFTS @  Labs:     Latest Ref Rng & Units 02/01/2022    8:20 PM 08/18/2020    8:50 AM 04/14/2015    3:55 PM  CBC  WBC 4.0 - 10.5 K/uL 10.5  10.6  15.4   Hemoglobin 12.0 - 15.0 g/dL 08/20/2020  04/16/2015  39.7   Hematocrit 36.0 - 46.0 % 38.0  36.9  36.6   Platelets 150 - 400 K/uL 266  265  246       Latest Ref Rng & Units 02/01/2022    8:20 PM 08/18/2020    8:50 AM 11/14/2015    3:48 PM  CMP  Glucose 70 - 99 mg/dL 08/20/2020  11/16/2015    BUN 6 - 20 mg/dL 12  12    Creatinine 379 - 1.00 mg/dL 024  0.97    Sodium 3.53 - 145 mmol/L 139  136    Potassium 3.5 - 5.1 mmol/L 3.8  3.8    Chloride 98 - 111 mmol/L 106  106    CO2 22 - 32 mmol/L 25  23     Calcium 8.9 - 10.3 mg/dL 9.0  9.1    Total Protein 6.5 - 8.1 g/dL 7.8   7.1   Total Bilirubin 0.3 - 1.2 mg/dL 0.3   0.2   Alkaline Phos 38 - 126 U/L 187  138   AST 15 - 41 U/L 18   15   ALT 0 - 44 U/L 16   15      Imaging studies:   RUQ [place] (02/01/2022) personally reviewed which shows stone in the neck of the GB, no changes consistent with cholecystitis, and radiologist report reviewed below:  IMPRESSION: 3.3 cm non mobile stone in the region of the gallbladder neck. No sonographic evidence of acute cholecystitis.    Assessment/Plan: (ICD-10's: K81.0) 51 y.o. female with abdominal pain, nausea, and emesis found to have symptomatic cholelithiasis vs clinical cholecystitis   - Will admit to general surgery service  - Plan for robotic assisted laparoscopic cholecystectomy this afternoon with Dr Everlene Farrier pending OR/Anesthesia availability  - All risks, benefits, and alternatives to above procedure(s) were discussed with the patient and her family, all of their questions were answered to their expressed satisfaction, patient expresses she wishes to proceed, and informed consent was obtained.   - NPO + IVF Resuscitation - IV Abx (Cipro + Flagyl) - ICG no call to OR   - Monitor abdominal examination; on-going bowel function - Pain control prn; antiemetics prn   - DVT prophylaxis; hold for OR  All of the above findings and recommendations were discussed with the patient and her family, and all of their questions were answered to their expressed satisfaction.  -- Lynden Oxford, PA-C [facility] 02/02/2022, 7:06 AM M-F: 7am - 4pm

## 2022-02-02 NOTE — ED Provider Triage Note (Signed)
Emergency Medicine Provider Triage Evaluation Note  Amanda Ashley , a 51 y.o. female  was evaluated in triage.  Pt complains of right upper quadrant pain.  Review of Systems  Positive: Right upper Quadrant pain, nausea and vomiting Negative: Fever or chills, dysuria, GI bleeding  Physical Exam  BP (!) 164/92 (BP Location: Left Arm)   Pulse 99   Temp 98.4 F (36.9 C) (Oral)   Resp 20   Ht 5\' 5"  (1.651 m)   Wt 127 kg   SpO2 99%   BMI 46.59 kg/m  Gen:   Awake, no distress   Resp:  Normal effort  MSK:   Moves extremities without difficulty  Other:  Upper quadrant tenderness to palpation without rigidity or guarding  Medical Decision Making  Medically screening exam initiated at 5:03 AM.  Appropriate orders placed.  Amanda Ashley Ashley was informed that the remainder of the evaluation will be completed by another provider, this initial triage assessment does not replace that evaluation, and the importance of remaining in the ED until their evaluation is complete.  I saw this patient after she received a right upper quadrant ultrasound that showed a 3 cm stone in the neck none mobile but no signs of cholecystitis.  Spoke with her about pain management and follow-up with outpatient surgeon for management but given her profound pain and inability to tolerate p.o. for the last day and a half, she elects to await a hospital bed for further evaluation, treatment and management.   , MD 02/02/22 [phone]

## 2022-02-02 NOTE — ED Notes (Signed)
Consent forms for surgery has been signed at this time. Pt is also changed out of street clothes into a gown and used wipes to clean herself. Pt was given belongings bags to put her belongings in at this time.

## 2022-02-03 ENCOUNTER — Observation Stay: Payer: 59 | Admitting: Anesthesiology

## 2022-02-03 ENCOUNTER — Other Ambulatory Visit: Payer: Self-pay

## 2022-02-03 ENCOUNTER — Encounter
Admission: EM | Disposition: A | Discharge status: Home / Self Care | Payer: Self-pay | Source: Home / Self Care | Attending: Emergency Medicine

## 2022-02-03 DIAGNOSIS — K810 Acute cholecystitis: Secondary | ICD-10-CM | POA: Diagnosis not present

## 2022-02-03 HISTORY — PX: CHOLECYSTECTOMY: SHX55

## 2022-02-03 LAB — COMPREHENSIVE METABOLIC PANEL
ALT: 13 U/L (ref 0–44)
AST: 19 U/L (ref 15–41)
Albumin: 3.3 g/dL — ABNORMAL LOW (ref 3.5–5.0)
Alkaline Phosphatase: 163 U/L — ABNORMAL HIGH (ref 38–126)
Anion gap: 4 — ABNORMAL LOW (ref 5–15)
BUN: 8 mg/dL (ref 6–20)
CO2: 25 mmol/L (ref 22–32)
Calcium: 8.3 mg/dL — ABNORMAL LOW (ref 8.9–10.3)
Chloride: 109 mmol/L (ref 98–111)
Creatinine, Ser: 0.64 mg/dL (ref 0.44–1.00)
GFR, Estimated: >60 mL/min (ref >60)
Glucose, Bld: 141 mg/dL — ABNORMAL HIGH (ref 70–99)
Potassium: 4.3 mmol/L (ref 3.5–5.1)
Sodium: 138 mmol/L (ref 135–145)
Total Bilirubin: 1.1 mg/dL (ref 0.3–1.2)
Total Protein: 7.1 g/dL (ref 6.5–8.1)

## 2022-02-03 LAB — CBC
HCT: 36.9 % (ref 36.0–46.0)
Hemoglobin: 12 g/dL (ref 12.0–15.0)
MCH: 28.7 pg (ref 26.0–34.0)
MCHC: 32.5 g/dL (ref 30.0–36.0)
MCV: 88.3 fL (ref 80.0–100.0)
Platelets: 223 10*3/uL (ref 150–400)
RBC: 4.18 MIL/uL (ref 3.87–5.11)
RDW: 13.2 % (ref 11.5–15.5)
WBC: 15.7 10*3/uL — ABNORMAL HIGH (ref 4.0–10.5)
nRBC: 0 % (ref 0.0–0.2)

## 2022-02-03 SURGERY — LAPAROSCOPIC CHOLECYSTECTOMY
Anesthesia: General

## 2022-02-03 MED ORDER — DEXAMETHASONE SODIUM PHOSPHATE 10 MG/ML IJ SOLN
INTRAMUSCULAR | Status: DC | PRN
  Administered 2022-02-03: 10 mg via INTRAVENOUS

## 2022-02-03 MED ORDER — FENTANYL CITRATE (PF) 100 MCG/2ML IJ SOLN
INTRAMUSCULAR | Status: DC | PRN
  Administered 2022-02-03: 100 ug via INTRAVENOUS

## 2022-02-03 MED ORDER — HYDROMORPHONE HCL 1 MG/ML IJ SOLN
INTRAMUSCULAR | Status: AC
  Filled 2022-02-03: qty 1

## 2022-02-03 MED ORDER — PROPOFOL 10 MG/ML IV BOLUS
INTRAVENOUS | Status: AC
  Filled 2022-02-03: qty 20

## 2022-02-03 MED ORDER — OXYCODONE HCL 5 MG/5ML PO SOLN: 5.0000 mg | Freq: Once | ORAL | Status: DC | PRN

## 2022-02-03 MED ORDER — ACETAMINOPHEN 500 MG PO TABS
ORAL_TABLET | ORAL | Status: AC
  Administered 2022-02-03: 1000 mg via ORAL
  Filled 2022-02-03: qty 2

## 2022-02-03 MED ORDER — PANTOPRAZOLE SODIUM 40 MG IV SOLR
INTRAVENOUS | Status: AC
  Filled 2022-02-03: qty 10

## 2022-02-03 MED ORDER — KETAMINE HCL 10 MG/ML IJ SOLN
INTRAMUSCULAR | Status: DC | PRN
  Administered 2022-02-03: 30 mg via INTRAVENOUS

## 2022-02-03 MED ORDER — ONDANSETRON HCL 4 MG/2ML IJ SOLN
INTRAMUSCULAR | Status: AC
  Filled 2022-02-03: qty 2

## 2022-02-03 MED ORDER — PHENYLEPHRINE 80 MCG/ML (10ML) SYRINGE FOR IV PUSH (FOR BLOOD PRESSURE SUPPORT)
PREFILLED_SYRINGE | INTRAVENOUS | Status: DC | PRN
  Administered 2022-02-03 (×2): 160 ug via INTRAVENOUS

## 2022-02-03 MED ORDER — ORAL CARE MOUTH RINSE: 15.0000 mL | OROMUCOSAL | Status: DC | PRN

## 2022-02-03 MED ORDER — BUPIVACAINE-EPINEPHRINE 0.25% -1:200000 IJ SOLN
INTRAMUSCULAR | Status: DC | PRN
  Administered 2022-02-03: 50 mL via INTRAMUSCULAR

## 2022-02-03 MED ORDER — SUGAMMADEX SODIUM 500 MG/5ML IV SOLN
INTRAVENOUS | Status: DC | PRN
  Administered 2022-02-03: 300 mg via INTRAVENOUS

## 2022-02-03 MED ORDER — ALUM & MAG HYDROXIDE-SIMETH 200-200-20 MG/5ML PO SUSP: 30.0000 mL | Freq: Three times a day (TID) | ORAL | Status: DC | PRN

## 2022-02-03 MED ORDER — FENTANYL CITRATE (PF) 100 MCG/2ML IJ SOLN
INTRAMUSCULAR | Status: AC
  Filled 2022-02-03: qty 2

## 2022-02-03 MED ORDER — OXYCODONE HCL 5 MG PO TABS
ORAL_TABLET | ORAL | Status: AC
  Administered 2022-02-03: 5 mg
  Filled 2022-02-03: qty 1

## 2022-02-03 MED ORDER — SUGAMMADEX SODIUM 500 MG/5ML IV SOLN
INTRAVENOUS | Status: AC
  Filled 2022-02-03: qty 5

## 2022-02-03 MED ORDER — MIDAZOLAM HCL 2 MG/2ML IJ SOLN
INTRAMUSCULAR | Status: DC | PRN
  Administered 2022-02-03: 2 mg via INTRAVENOUS

## 2022-02-03 MED ORDER — HYDROMORPHONE HCL 1 MG/ML IJ SOLN
INTRAMUSCULAR | Status: DC | PRN
  Administered 2022-02-03 (×2): .5 mg via INTRAVENOUS

## 2022-02-03 MED ORDER — LIDOCAINE HCL (PF) 2 % IJ SOLN
INTRAMUSCULAR | Status: AC
  Filled 2022-02-03: qty 5

## 2022-02-03 MED ORDER — KETAMINE HCL 50 MG/5ML IJ SOSY
PREFILLED_SYRINGE | INTRAMUSCULAR | Status: AC
  Filled 2022-02-03: qty 5

## 2022-02-03 MED ORDER — KETOROLAC TROMETHAMINE 30 MG/ML IJ SOLN
INTRAMUSCULAR | Status: AC
  Filled 2022-02-03: qty 1

## 2022-02-03 MED ORDER — BUPIVACAINE-EPINEPHRINE (PF) 0.25% -1:200000 IJ SOLN
INTRAMUSCULAR | Status: AC
  Filled 2022-02-03: qty 30

## 2022-02-03 MED ORDER — ROCURONIUM BROMIDE 10 MG/ML (PF) SYRINGE
PREFILLED_SYRINGE | INTRAVENOUS | Status: AC
  Filled 2022-02-03: qty 10

## 2022-02-03 MED ORDER — CIPROFLOXACIN IN D5W 400 MG/200ML IV SOLN
INTRAVENOUS | Status: AC
  Administered 2022-02-03: 400 mg
  Filled 2022-02-03: qty 200

## 2022-02-03 MED ORDER — ACETAMINOPHEN 500 MG PO TABS
ORAL_TABLET | ORAL | Status: AC
  Filled 2022-02-03: qty 2

## 2022-02-03 MED ORDER — OXYCODONE HCL 5 MG PO TABS: 5.0000 mg | ORAL_TABLET | Freq: Once | ORAL | Status: DC | PRN

## 2022-02-03 MED ORDER — MIDAZOLAM HCL 2 MG/2ML IJ SOLN
INTRAMUSCULAR | Status: AC
  Filled 2022-02-03: qty 2

## 2022-02-03 MED ORDER — PHENYLEPHRINE 80 MCG/ML (10ML) SYRINGE FOR IV PUSH (FOR BLOOD PRESSURE SUPPORT)
PREFILLED_SYRINGE | INTRAVENOUS | Status: AC
  Filled 2022-02-03: qty 10

## 2022-02-03 MED ORDER — KETOROLAC TROMETHAMINE 30 MG/ML IJ SOLN
30.0000 mg | Freq: Four times a day (QID) | INTRAMUSCULAR | Status: DC
  Administered 2022-02-03 – 2022-02-04 (×2): 30 mg via INTRAVENOUS

## 2022-02-03 MED ORDER — LIDOCAINE HCL (CARDIAC) PF 100 MG/5ML IV SOSY
PREFILLED_SYRINGE | INTRAVENOUS | Status: DC | PRN
  Administered 2022-02-03: 80 mg via INTRAVENOUS

## 2022-02-03 MED ORDER — BUPIVACAINE LIPOSOME 1.3 % IJ SUSP
INTRAMUSCULAR | Status: AC
  Filled 2022-02-03: qty 20

## 2022-02-03 MED ORDER — HYDROMORPHONE HCL 1 MG/ML IJ SOLN: 0.2500 mg | INTRAMUSCULAR | Status: DC | PRN

## 2022-02-03 MED ORDER — PROPOFOL 10 MG/ML IV BOLUS
INTRAVENOUS | Status: DC | PRN
  Administered 2022-02-03: 130 mg via INTRAVENOUS
  Administered 2022-02-03: 40 mg via INTRAVENOUS

## 2022-02-03 MED ORDER — SODIUM CHLORIDE 0.9 % IR SOLN
Status: DC | PRN
  Administered 2022-02-03: 1000 mL

## 2022-02-03 MED ORDER — ALUM & MAG HYDROXIDE-SIMETH 200-200-20 MG/5ML PO SUSP
ORAL | Status: AC
  Administered 2022-02-03: 30 mL via ORAL
  Filled 2022-02-03: qty 30

## 2022-02-03 MED ORDER — ROCURONIUM BROMIDE 100 MG/10ML IV SOLN
INTRAVENOUS | Status: DC | PRN
  Administered 2022-02-03: 60 mg via INTRAVENOUS

## 2022-02-03 MED ORDER — DEXAMETHASONE SODIUM PHOSPHATE 10 MG/ML IJ SOLN
INTRAMUSCULAR | Status: AC
  Filled 2022-02-03: qty 1

## 2022-02-03 MED ORDER — ONDANSETRON HCL 4 MG/2ML IJ SOLN
INTRAMUSCULAR | Status: DC | PRN
  Administered 2022-02-03: 4 mg via INTRAVENOUS

## 2022-02-03 MED ORDER — KETOROLAC TROMETHAMINE 30 MG/ML IJ SOLN
INTRAMUSCULAR | Status: AC
  Administered 2022-02-03: 30 mg via INTRAVENOUS
  Filled 2022-02-03: qty 1

## 2022-02-03 MED ORDER — DEXMEDETOMIDINE HCL IN NACL 80 MCG/20ML IV SOLN
INTRAVENOUS | Status: DC | PRN
  Administered 2022-02-03: 8 ug via BUCCAL
  Administered 2022-02-03: 12 ug via BUCCAL

## 2022-02-03 SURGICAL SUPPLY — 46 items
APPLICATOR COTTON TIP 6 STRL (MISCELLANEOUS) ×1 IMPLANT
APPLICATOR COTTON TIP 6IN STRL (MISCELLANEOUS) ×1 IMPLANT
APPLIER CLIP 5 13 M/L LIGAMAX5 (MISCELLANEOUS) ×1
BULB RESERV EVAC DRAIN JP 100C (MISCELLANEOUS) IMPLANT
CATH REDDICK CHOLANGI 4FR 50CM (CATHETERS) IMPLANT
CLIP APPLIE 5 13 M/L LIGAMAX5 (MISCELLANEOUS) ×1 IMPLANT
DERMABOND ADVANCED .7 DNX12 (GAUZE/BANDAGES/DRESSINGS) ×1 IMPLANT
DRAIN CHANNEL JP 19F (MISCELLANEOUS) IMPLANT
DRAPE C-ARM XRAY 36X54 (DRAPES) ×2 IMPLANT
ELECT CAUTERY BLADE TIP 2.5 (TIP) ×1
ELECT REM PT RETURN 9FT ADLT (ELECTROSURGICAL) ×1
ELECTRODE CAUTERY BLDE TIP 2.5 (TIP) ×1 IMPLANT
ELECTRODE REM PT RTRN 9FT ADLT (ELECTROSURGICAL) ×1 IMPLANT
GLOVE BIO SURGEON STRL SZ7 (GLOVE) ×1 IMPLANT
GOWN STRL REUS W/ TWL LRG LVL3 (GOWN DISPOSABLE) ×3 IMPLANT
GOWN STRL REUS W/TWL LRG LVL3 (GOWN DISPOSABLE) ×3
IRRIGATION STRYKERFLOW (MISCELLANEOUS) ×1 IMPLANT
IRRIGATOR STRYKERFLOW (MISCELLANEOUS) ×1
IV CATH ANGIO 12GX3 LT BLUE (NEEDLE) IMPLANT
IV NS 1000ML (IV SOLUTION) ×1
IV NS 1000ML BAXH (IV SOLUTION) ×1 IMPLANT
L-HOOK LAP DISP 36CM (ELECTROSURGICAL) ×1
LHOOK LAP DISP 36CM (ELECTROSURGICAL) ×1 IMPLANT
MANIFOLD NEPTUNE II (INSTRUMENTS) ×1 IMPLANT
NEEDLE HYPO 22GX1.5 SAFETY (NEEDLE) ×1 IMPLANT
NS IRRIG 500ML POUR BTL (IV SOLUTION) ×1 IMPLANT
PACK LAP CHOLECYSTECTOMY (MISCELLANEOUS) ×1 IMPLANT
PENCIL SMOKE EVACUATOR (MISCELLANEOUS) ×1 IMPLANT
SCISSORS METZENBAUM CVD 33 (INSTRUMENTS) ×1 IMPLANT
SET TUBE SMOKE EVAC HIGH FLOW (TUBING) ×1 IMPLANT
SLEEVE Z-THREAD 5X100MM (TROCAR) ×2 IMPLANT
SPIKE FLUID TRANSFER (MISCELLANEOUS) ×2 IMPLANT
SPONGE T-LAP 18X18 ~~LOC~~+RFID (SPONGE) ×1 IMPLANT
STOPCOCK 4 WAY LG BORE MALE ST (IV SETS) IMPLANT
SUT ETHIBOND 0 MO6 C/R (SUTURE) IMPLANT
SUT ETHILON 3-0 FS-10 30 BLK (SUTURE) ×1
SUT MNCRL AB 4-0 PS2 18 (SUTURE) ×1 IMPLANT
SUT VICRYL 0 UR6 27IN ABS (SUTURE) ×2 IMPLANT
SUTURE EHLN 3-0 FS-10 30 BLK (SUTURE) IMPLANT
SYR 20ML LL LF (SYRINGE) ×1 IMPLANT
SYS BAG RETRIEVAL 10MM (BASKET) ×1
SYSTEM BAG RETRIEVAL 10MM (BASKET) ×1 IMPLANT
TRAP FLUID SMOKE EVACUATOR (MISCELLANEOUS) ×1 IMPLANT
TROCAR XCEL BLUNT TIP 100MML (ENDOMECHANICALS) ×1 IMPLANT
TROCAR XCEL NON-BLD 5MMX100MML (ENDOMECHANICALS) ×1 IMPLANT
WATER STERILE IRR 500ML POUR (IV SOLUTION) ×1 IMPLANT

## 2022-02-03 NOTE — Anesthesia Preprocedure Evaluation (Addendum)
Anesthesia Evaluation  Patient identified by MRN, date of birth, ID band Patient awake    Reviewed: Allergy & Precautions, NPO status , Patient's Chart, lab work & pertinent test results  History of Anesthesia Complications Negative for: history of anesthetic complications  Airway Mallampati: III  TM Distance: >3 FB Neck ROM: full    Dental  (+) Teeth Intact   Pulmonary neg pulmonary ROS   Pulmonary exam normal        Cardiovascular negative cardio ROS Normal cardiovascular exam     Neuro/Psych negative neurological ROS  negative psych ROS   GI/Hepatic Neg liver ROS,GERD  Medicated and Controlled,,  Endo/Other    Morbid obesity  Renal/GU      Musculoskeletal   Abdominal   Peds  Hematology  (+) REFUSES BLOOD PRODUCTS  Anesthesia Other Findings Past Medical History: No date: Acid reflux No date: Arrhythmia  Past Surgical History: No date: OOPHORECTOMY; Right No date: TUBAL LIGATION  BMI    Body Mass Index: 46.59 kg/m      Reproductive/Obstetrics negative OB ROS                             Anesthesia Physical Anesthesia Plan  ASA: 3  Anesthesia Plan: General ETT   Post-op Pain Management: Toradol IV (intra-op)*, Ofirmev IV (intra-op)* and Dilaudid IV   Induction: Intravenous  PONV Risk Score and Plan: Ondansetron, Dexamethasone, Midazolam and Treatment may vary due to age or medical condition  Airway Management Planned: Oral ETT  Additional Equipment:   Intra-op Plan:   Post-operative Plan: Extubation in OR  Informed Consent: I have reviewed the patients History and Physical, chart, labs and discussed the procedure including the risks, benefits and alternatives for the proposed anesthesia with the patient or authorized representative who has indicated his/her understanding and acceptance.     Dental Advisory Given  Plan Discussed with: Anesthesiologist, CRNA and  Surgeon  Anesthesia Plan Comments: (Patient consented for risks of anesthesia including but not limited to:  - adverse reactions to medications - damage to eyes, teeth, lips or other oral mucosa - nerve damage due to positioning  - sore throat or hoarseness - Damage to heart, brain, nerves, lungs, other parts of body or loss of life  Patient voiced understanding.)       Anesthesia Quick Evaluation

## 2022-02-03 NOTE — Anesthesia Postprocedure Evaluation (Signed)
Anesthesia Post Note  Patient: TASNIM BALENTINE REYES  Procedure(s) Performed: LAPAROSCOPIC CHOLECYSTECTOMY  Patient location during evaluation: PACU Anesthesia Type: General Level of consciousness: awake and alert Pain management: pain level controlled Vital Signs Assessment: post-procedure vital signs reviewed and stable Respiratory status: spontaneous breathing, nonlabored ventilation, respiratory function stable and patient connected to nasal cannula oxygen Cardiovascular status: blood pressure returned to baseline and stable Postop Assessment: no apparent nausea or vomiting Anesthetic complications: no   No notable events documented.   Last Vitals:  Vitals:   02/03/22 1225 02/03/22 1437  BP: (!) 140/89 112/65  Pulse: 94 (!) 107  Resp:  13  Temp: 37.2 C 36.7 C  SpO2: 95% 98%    Last Pain:  Vitals:   02/03/22 1437  TempSrc:   PainSc: 0-No pain                 Louie Boston

## 2022-02-03 NOTE — Progress Notes (Signed)
[facility] SURGICAL PROGRESS NOTE  Hospital Day(s): 0.   Interval History:  Patient seen and examined No acute events or new complaints overnight.  Patient reports she continues to have RUQ soreness; similar to yesterday No fever, chills, nausea, emesis She does have a leukocytosis this AM to 15.7K Hgb to 12.0 PLT to 223K Renal function normal; sCr - 0.64 Bilirubin normal to 1.1 She is on Cipro + Flagyl  NPO for planned procedure today    Vital signs in last 24 hours: [min-max] current  Temp:  [98.3 F (36.8 C)-98.8 F (37.1 C)] 98.3 F (36.8 C) (12/13 0631) Pulse Rate:  [94-100] 94 (12/13 0700) Resp:  [12-24] 14 (12/13 0700) BP: (140-163)/(79-93) 152/83 (12/13 0700) SpO2:  [94 %-99 %] 94 % (12/13 0700)     Height: 5\' 5"  (165.1 cm) Weight: 127 kg BMI (Calculated): 46.59   Intake/Output last 2 shifts:  12/12 0701 - 12/13 0700 In: 356.4 [IV Piggyback:356.4] Out: -    Physical Exam:  Constitutional: alert, cooperative and no distress  Respiratory: breathing non-labored at rest  Cardiovascular: regular rate and sinus rhythm  Gastrointestinal: soft, she remains tender in RUQ; non-distended, no rebound/guarding Integumentary: Warm, Dry, No Juandice   Labs:     Latest Ref Rng & Units 02/03/2022    4:17 AM 02/01/2022    8:20 PM 08/18/2020    8:50 AM  CBC  WBC 4.0 - 10.5 K/uL 15.7  10.5  10.6   Hemoglobin 12.0 - 15.0 g/dL 08/20/2020  16.9  67.8   Hematocrit 36.0 - 46.0 % 36.9  38.0  36.9   Platelets 150 - 400 K/uL 223  266  265       Latest Ref Rng & Units 02/03/2022    4:17 AM 02/01/2022    8:20 PM 08/18/2020    8:50 AM  CMP  Glucose 70 - 99 mg/dL 08/20/2020  101  751   BUN 6 - 20 mg/dL 8  12  12    Creatinine 0.44 - 1.00 mg/dL 025   8.52   Sodium 135 - 145 mmol/L 138  139  136   Potassium 3.5 - 5.1 mmol/L 4.3  3.8  3.8   Chloride 98 - 111 mmol/L 109  106  106   CO2 22 - 32 mmol/L 25  25  23    Calcium 8.9 - 10.3 mg/dL 8.3  9.0  9.1   Total Protein  6.5 - 8.1 g/dL 7.1  7.8    Total Bilirubin 0.3 - 1.2 mg/dL 1.1  0.3    Alkaline Phos 38 - 126 U/L 163  187    AST 15 - 41 U/L 19  18    ALT 0 - 44 U/L 13  16       Imaging studies: No new pertinent imaging studies   Assessment/Plan: (ICD-10's: K81.0) 51 y.o. female with abdominal pain, nausea, and emesis found to have symptomatic cholelithiasis vs clinical cholecystitis              - Plan for robotic assisted laparoscopic cholecystectomy this afternoon with Dr 2.42 pending OR/Anesthesia availability             - All risks, benefits, and alternatives to above procedure(s) were discussed with the patient and her family, all of their questions were answered to their expressed satisfaction, patient expresses she wishes to proceed, and informed consent was obtained.              - NPO +  IVF Resuscitation - IV Abx (Cipro + Flagyl) - ICG no call to OR              - Monitor abdominal examination; on-going bowel function - Pain control prn; antiemetics prn              - DVT prophylaxis; hold for OR  All of the above findings and recommendations were discussed with the patient, patient's family, and the medical team, and all of patient's and family's questions were answered to their expressed satisfaction.  -- Lynden Oxford, PA-C [facility] 02/03/2022, 8:10 AM M-F: 7am - 4pm

## 2022-02-03 NOTE — Transfer of Care (Signed)
Immediate Anesthesia Transfer of Care Note  Patient: Amanda Ashley  Procedure(s) Performed: LAPAROSCOPIC CHOLECYSTECTOMY  Patient Location: PACU  Anesthesia Type:General  Level of Consciousness: awake, alert , and oriented  Airway & Oxygen Therapy: Patient Spontanous Breathing and Patient connected to face mask oxygen  Post-op Assessment: Report given to RN, Post -op Vital signs reviewed and stable, and Patient moving all extremities  Post vital signs: Reviewed and stable  Last Vitals:  Vitals Value Taken Time  BP 112/65 02/03/22 1437  Temp 36.7 C 02/03/22 1437  Pulse 110 02/03/22 1440  Resp 17 02/03/22 1440  SpO2 98 % 02/03/22 1440  Vitals shown include unvalidated device data.  Last Pain:  Vitals:   02/03/22 1225  TempSrc: Oral  PainSc:          Complications: No notable events documented.

## 2022-02-03 NOTE — TOC Initial Note (Signed)
Transition of Care [facility]) - Initial/Assessment Note    Patient Details  Name: Amanda Ashley MRN: [id number] Date of Birth: Jan 28, 1971  Transition of Care [facility]) CM/SW Contact:    Allayne Butcher, RN Phone Number: 02/03/2022, 9:40 AM  Clinical Narrative:                  Transition of Care [facility]) Screening Note   Patient Details  Name: Amanda Ashley Date of Birth: August 09, 1970   Transition of Care [facility]) CM/SW Contact:    Allayne Butcher, RN Phone Number: 02/03/2022, 9:40 AM    Transition of Care Department [facility]) has reviewed patient and no TOC needs have been identified at this time. We will continue to monitor patient advancement through interdisciplinary progression rounds. If new patient transition needs arise, please place a TOC consult.          Patient Goals and CMS Choice        Expected Discharge Plan and Services                                                Prior Living Arrangements/Services                       Activities of Daily Living      Permission Sought/Granted                  Emotional Assessment              Admission diagnosis:  Acute cholecystitis [K81.0] Patient Active Problem List   Diagnosis Date Noted   Acute cholecystitis 02/02/2022   PCP:  [facility] Pharmacy:   [id number] [place], [place] - [address] AT [facility] [address] [place] [address] [place] [address] Phone: [phone] Fax: [phone]     Social Determinants of Health (SDOH) Interventions    Readmission Risk Interventions     No data to display

## 2022-02-03 NOTE — Anesthesia Procedure Notes (Signed)
Procedure Name: Intubation Date/Time: 02/03/2022 1:34 PM  Performed by: Katherine Basset, CRNAPre-anesthesia Checklist: Patient identified, Emergency Drugs available, Suction available and Patient being monitored Patient Re-evaluated:Patient Re-evaluated prior to induction Oxygen Delivery Method: Circle system utilized Preoxygenation: Pre-oxygenation with 100% oxygen Induction Type: IV induction Ventilation: Mask ventilation without difficulty and Oral airway inserted - appropriate to patient size Laryngoscope Size: Hyacinth Meeker and 2 Grade View: Grade I Tube type: Oral Tube size: 7.0 mm Number of attempts: 1 Airway Equipment and Method: Stylet, Oral airway and Bite block Placement Confirmation: ETT inserted through vocal cords under direct vision, positive ETCO2 and breath sounds checked- equal and bilateral Secured at: 21 cm Tube secured with: Tape Dental Injury: Teeth and Oropharynx as per pre-operative assessment

## 2022-02-03 NOTE — Op Note (Signed)
Laparoscopic Cholecystectomy  Pre-operative Diagnosis: cholecystitis  Post-operative Diagnosis: same  Procedure: lap cholecystectomy  Surgeon: Sterling Big, MD FACS  Anesthesia: Gen. with endotracheal tube  Findings: Severe acute  Cholecystitis  Hydrops and empyema of the GB  Estimated Blood Loss: 10cc         Drains: 19FR         Specimens: Gallbladder           Complications: none   Procedure Details  The patient was seen again in the Holding Room. The benefits, complications, treatment options, and expected outcomes were discussed with the patient. The risks of bleeding, infection, recurrence of symptoms, failure to resolve symptoms, bile duct damage, bile duct leak, retained common bile duct stone, bowel injury, any of which could require further surgery and/or ERCP, stent, or papillotomy were reviewed with the patient. The likelihood of improving the patient's symptoms with return to their baseline status is good.  The patient and/or family concurred with the proposed plan, giving informed consent.  The patient was taken to Operating Room, identified as Amanda Ashley and the procedure verified as Laparoscopic Cholecystectomy.  A Time Out was held and the above information confirmed.  Prior to the induction of general anesthesia, antibiotic prophylaxis was administered. VTE prophylaxis was in place. General endotracheal anesthesia was then administered and tolerated well. After the induction, the abdomen was prepped with Chloraprep and draped in the sterile fashion. The patient was positioned in the supine position.  Cut down technique was used to enter the abdominal cavity and a Hasson trochar was placed after two vicryl stitches were anchored to the fascia. Pneumoperitoneum was then created with CO2 and tolerated well without any adverse changes in the patient's vital signs.  Three 5-mm ports were placed in the right upper quadrant all under direct vision. All skin incisions   were infiltrated with a local anesthetic agent before making the incision and placing the trocars.   The patient was positioned  in reverse Trendelenburg, tilted slightly to the patient's left.  The gallbladder was identified, the fundus grasped and retracted cephalad. Gb was distendded and tense, using lap suction I decompressed the Gb in the standard fashion. Pus was evacuated and suctioned. Adhesions were lysed bluntly. The infundibulum was grasped and retracted laterally, exposing the peritoneum overlying the triangle of Calot. This was then divided and exposed in a blunt fashion. An extended critical view of the cystic duct and cystic artery was obtained.  The cystic duct was clearly identified and bluntly dissected.   Artery and duct were double clipped and divided.  The gallbladder was taken from the gallbladder fossa in a retrograde fashion with the electrocautery. The gallbladder was removed and placed in an Endocatch bag. The liver bed was irrigated and inspected. Hemostasis was achieved with the electrocautery. Copious irrigation was utilized and was repeatedly aspirated until clear.  The gallbladder and Endocatch sac were then removed through a port site.   19 Fr blake placed RUQ.  Inspection of the right upper quadrant was performed. No bleeding, bile duct injury or leak, or bowel injury was noted. Pneumoperitoneum was released.  The periumbilical port site was closed with interrumpted 0 Vicryl sutures. 4-0 subcuticular Monocryl was used to close the skin. Dermabond was  applied.  The patient was then extubated and brought to the recovery room in stable condition. Sponge, lap, and needle counts were correct at closure and at the conclusion of the case.  Caroleen Hamman, MD, FACS

## 2022-02-04 ENCOUNTER — Encounter: Payer: Self-pay | Admitting: Surgery

## 2022-02-04 LAB — COMPREHENSIVE METABOLIC PANEL
ALT: 22 U/L (ref 0–44)
AST: 28 U/L (ref 15–41)
Albumin: 3.1 g/dL — ABNORMAL LOW (ref 3.5–5.0)
Alkaline Phosphatase: 140 U/L — ABNORMAL HIGH (ref 38–126)
Anion gap: 4 — ABNORMAL LOW (ref 5–15)
BUN: 12 mg/dL (ref 6–20)
CO2: 25 mmol/L (ref 22–32)
Calcium: 8.7 mg/dL — ABNORMAL LOW (ref 8.9–10.3)
Chloride: 110 mmol/L (ref 98–111)
Creatinine, Ser: 0.66 mg/dL (ref 0.44–1.00)
GFR, Estimated: >60 mL/min (ref >60)
Glucose, Bld: 155 mg/dL — ABNORMAL HIGH (ref 70–99)
Potassium: 4.3 mmol/L (ref 3.5–5.1)
Sodium: 139 mmol/L (ref 135–145)
Total Bilirubin: 0.4 mg/dL (ref 0.3–1.2)
Total Protein: 6.9 g/dL (ref 6.5–8.1)

## 2022-02-04 MED ORDER — PANTOPRAZOLE SODIUM 40 MG IV SOLR
INTRAVENOUS | Status: AC
  Filled 2022-02-04: qty 10

## 2022-02-04 MED ORDER — KETOROLAC TROMETHAMINE 30 MG/ML IJ SOLN
INTRAMUSCULAR | Status: AC
  Filled 2022-02-04: qty 1

## 2022-02-04 MED ORDER — OXYCODONE HCL 5 MG PO TABS: 5.0000 mg | ORAL_TABLET | Freq: Four times a day (QID) | ORAL | 0 refills | Status: DC | PRN

## 2022-02-04 MED ORDER — CIPROFLOXACIN IN D5W 400 MG/200ML IV SOLN
INTRAVENOUS | Status: AC
  Filled 2022-02-04: qty 200

## 2022-02-04 MED ORDER — CIPROFLOXACIN HCL 500 MG PO TABS: 500.0000 mg | ORAL_TABLET | Freq: Two times a day (BID) | ORAL | 0 refills | Status: DC

## 2022-02-04 MED ORDER — METRONIDAZOLE 500 MG PO TABS: 500.0000 mg | ORAL_TABLET | Freq: Three times a day (TID) | ORAL | 0 refills | Status: AC

## 2022-02-04 MED ORDER — ACETAMINOPHEN 500 MG PO TABS
ORAL_TABLET | ORAL | Status: AC
  Filled 2022-02-04: qty 2

## 2022-02-04 MED ORDER — ONDANSETRON 4 MG PO TBDP: 4.0000 mg | ORAL_TABLET | Freq: Four times a day (QID) | ORAL | 0 refills | Status: DC | PRN

## 2022-02-04 NOTE — Progress Notes (Signed)
Discharge order received. Vital sign stable. No signs of acute distress. Tolerating diet.Ambulating. Discharge instructions given to patient and son including JP care. Patient verbalized understanding. No other issues noted.

## 2022-02-04 NOTE — Discharge Summary (Signed)
[facility] SURGICAL DISCHARGE SUMMARY  Patient ID: Amanda Ashley MRN: [id number] DOB/AGE: 1970/02/24 51 y.o.  Admit date: 02/02/2022 Discharge date: 02/04/2022  Discharge Diagnoses Patient Active Problem List   Diagnosis Date Noted   Calculus of gallbladder with acute cholecystitis without obstruction 02/02/2022    Consultants None  Procedures 02/03/2022:  Laparoscopic Cholecystectomy  HPI: 51 y.o. female presented to [facility] ED overnight for abdominal pain. Patient reports the acute onset of upper abdominal discomfort over the course of the last 3 days. This was crampy in nature. Became more severe overnight with accompanying nausea and emesis which prompted presentation to the ED. She denied any fever, chills, cough, CP, SOB, bowel changes, or juandice. She does not believe she has a history of similar in the past but does endorsed reflux. Previous intra-abdominal surgeries include oophorectomy an tubal ligation. Work up in the ED revealed a normal WBC at 10.5K, renal function normal with sCr - 0.72, no electrolyte derangements, LFTs and bilirubin in normal ranges. RUQ US showed cholelithiasis in neck of the GB without significant evidence of cholecystitis.   Hospital Course: Informed consent was obtained and documented, and patient underwent uneventful laparoscopic cholecystectomy (Dr Everlene Farrier, 02/03/2022).  Post-operatively, patient's pain and symptoms improved/resolved and advancement of patient's diet and ambulation were well-tolerated. The remainder of patient's hospital course was essentially unremarkable, and discharge planning was initiated accordingly with patient safely able to be discharged home with appropriate discharge instructions, antibiotics (Cipro + flagyl x7 days), pain control, and outpatient follow-up after all of her questions were answered to her expressed satisfaction.   Discharge Condition: Good   Physical Examination:  Constitutional: Well  appearing female, NAD Pulmonary: Normal effort, no respiratory distress  Gastrointestinal: Soft, non-tender, non-distended, no rebound/guarding. Drain in RUQ; output serosanguinous  Skin: Laparoscopic incisions are CDI with dermabond, no erythema or drainage    Allergies as of 02/04/2022   No Known Allergies      Medication List     TAKE these medications    ciprofloxacin 500 MG tablet Commonly known as: Cipro Take 1 tablet (500 mg total) by mouth 2 (two) times daily for 7 days.   diazepam 5 MG tablet Commonly known as: Valium Take 1 tablet (5 mg total) by mouth every 8 (eight) hours as needed for anxiety.   meloxicam 15 MG tablet Commonly known as: MOBIC TAKE 1 TABLET(15 MG) BY MOUTH DAILY   methylPREDNISolone 4 MG Tbpk tablet Commonly known as: MEDROL DOSEPAK 6 day dose pack - take as directed   metroNIDAZOLE 500 MG tablet Commonly known as: Flagyl Take 1 tablet (500 mg total) by mouth 3 (three) times daily for 7 days.   ondansetron 4 MG disintegrating tablet Commonly known as: ZOFRAN-ODT Take 1 tablet (4 mg total) by mouth every 6 (six) hours as needed for nausea.   oxyCODONE 5 MG immediate release tablet Commonly known as: Oxy IR/ROXICODONE Take 1 tablet (5 mg total) by mouth every 6 (six) hours as needed for severe pain or breakthrough pain.   terbinafine 250 MG tablet Commonly known as: LamISIL Take 1 tablet (250 mg total) by mouth daily.          Follow-up Information     Donovan Kail, PA-C. Schedule an appointment as soon as possible for a visit in 1 week(s).   Specialty: Physician Assistant Why: s/p lap chole, has drain Contact information: [address] [place] [address] [phone]  Time spent on discharge management including discussion of hospital course, clinical condition, outpatient instructions, prescriptions, and follow up with the patient and members of the medical team: >30  minutes  -- Lynden Oxford , PA-C [facility]  02/04/2022, 10:37 AM [phone] M-F: 7am - 4pm

## 2022-02-04 NOTE — Discharge Instructions (Signed)
In addition to included general post-operative instructions,  Diet: Resume home diet. Recommend avoiding or limiting fatty/greasy foods over the next few days/week. If you do eat these, you may (or may not) notice diarrhea. This is expected while your body adjusts to not having a gallbladder, and it typically resolves with time.    Activity: No heavy lifting >20 pounds (children, pets, laundry, garbage) or strenuous activity for 4 weeks, but light activity and walking are encouraged. Do not drive or drink alcohol if taking narcotic pain medications or having pain that might distract from driving.  Wound care: If you can keep drain site covered and water proofed, 2 days after surgery (12/15), you may shower/get incision wet with soapy water and pat dry (do not rub incisions), but no baths or submerging incision underwater until follow-up.   Drain: Monitor and record drain output daily. Left hand out to record daily. Please bring this with you to follow up.   Medications: Resume all home medications. For mild to moderate pain: acetaminophen (Tylenol) or ibuprofen/naproxen (if no kidney disease). Combining Tylenol with alcohol can substantially increase your risk of causing liver disease. Narcotic pain medications, if prescribed, can be used for severe pain, though may cause nausea, constipation, and drowsiness. Do not combine Tylenol and Percocet (or similar) within a 6 hour period as Percocet (and similar) contain(s) Tylenol. If you do not need the narcotic pain medication, you do not need to fill the prescription.  Call office [phone] / [phone]) at any time if any questions, worsening pain, fevers/chills, bleeding, drainage from incision site, or other concerns.

## 2022-02-05 LAB — SURGICAL PATHOLOGY

## 2022-02-11 ENCOUNTER — Ambulatory Visit (INDEPENDENT_AMBULATORY_CARE_PROVIDER_SITE_OTHER): Payer: 59 | Admitting: Physician Assistant

## 2022-02-11 ENCOUNTER — Encounter: Payer: Self-pay | Admitting: Physician Assistant

## 2022-02-11 ENCOUNTER — Other Ambulatory Visit: Payer: Self-pay

## 2022-02-11 VITALS — BP 121/88 | HR 103 | Temp 98.1°F | Ht 65.0 in | Wt 216.0 lb

## 2022-02-11 DIAGNOSIS — K8000 Calculus of gallbladder with acute cholecystitis without obstruction: Secondary | ICD-10-CM

## 2022-02-11 DIAGNOSIS — K810 Acute cholecystitis: Secondary | ICD-10-CM

## 2022-02-11 DIAGNOSIS — Z09 Encounter for follow-up examination after completed treatment for conditions other than malignant neoplasm: Secondary | ICD-10-CM

## 2022-02-11 NOTE — Patient Instructions (Signed)

## 2022-02-11 NOTE — Progress Notes (Signed)
[facility] POST-OP OFFICE VISIT  02/11/2022  HPI: Amanda Ashley is a 51 y.o. female 8 days s/p laparoscopic cholecystectomy for acute cholecystitis  She is doing well Main complaint is itching No fever, chills, nausea, emesis, or abdominal pain She has remained on liquid diet out of fear Drain with <10 ccs daily; serosanguinous Incisions well healed No other complaints   Vital signs: BP 121/88   Pulse (!) 103   Temp 98.1 F (36.7 C) (Oral)   Ht 5\' 5"  (1.651 m)   Wt 216 lb (98 kg)   SpO2 99%   BMI 35.94 kg/m    Physical Exam: Constitutional: Well appearing female, NAD Abdomen: Soft, non-tender, non-distended, no rebound/guarding. Surgical drain in RUQ; serosanguinous (removed) Skin: Laparoscopic incisions are healing well, no erythema or drainage   Assessment/Plan: This is a 51 y.o. female 8 days s/p laparoscopic cholecystectomy for acute cholecystitis   - Drain removed; occlusive dressing placed  - Okay to advance to regular diet; reviewed recommendations regarding fatty foods  - Pain control prn  - Reviewed wound care recommendation  - Reviewed lifting restrictions; 4 weeks total; return to work note given   - Reviewed surgical pathology; acute cholecystitis  - She can follow up on as needed basis; She understands to call with questions/concerns  -- 45, PA-C [facility] 02/11/2022, 3:11 PM M-F: 7am - 4pm

## 2022-07-29 ENCOUNTER — Other Ambulatory Visit: Payer: Self-pay | Admitting: Internal Medicine

## 2022-07-29 DIAGNOSIS — Z1231 Encounter for screening mammogram for malignant neoplasm of breast: Secondary | ICD-10-CM

## 2023-11-03 ENCOUNTER — Other Ambulatory Visit: Payer: Self-pay

## 2023-11-03 ENCOUNTER — Emergency Department
Admission: EM | Admit: 2023-11-03 | Discharge: 2023-11-03 | Disposition: A | Discharge status: Home / Self Care | Attending: Emergency Medicine | Admitting: Emergency Medicine

## 2023-11-03 ENCOUNTER — Emergency Department

## 2023-11-03 DIAGNOSIS — K2900 Acute gastritis without bleeding: Secondary | ICD-10-CM | POA: Insufficient documentation

## 2023-11-03 DIAGNOSIS — K449 Diaphragmatic hernia without obstruction or gangrene: Secondary | ICD-10-CM | POA: Insufficient documentation

## 2023-11-03 DIAGNOSIS — R1012 Left upper quadrant pain: Secondary | ICD-10-CM | POA: Diagnosis present

## 2023-11-03 DIAGNOSIS — K432 Incisional hernia without obstruction or gangrene: Secondary | ICD-10-CM | POA: Insufficient documentation

## 2023-11-03 LAB — URINALYSIS, ROUTINE W REFLEX MICROSCOPIC
Bilirubin Urine: NEGATIVE
Glucose, UA: NEGATIVE mg/dL
Ketones, ur: NEGATIVE mg/dL
Leukocytes,Ua: NEGATIVE
Nitrite: NEGATIVE
Protein, ur: NEGATIVE mg/dL
Specific Gravity, Urine: 1.027 (ref 1.005–1.030)
pH: 5 (ref 5.0–8.0)

## 2023-11-03 LAB — CBC
HCT: 43.8 % (ref 36.0–46.0)
Hemoglobin: 14 g/dL (ref 12.0–15.0)
MCH: 28.2 pg (ref 26.0–34.0)
MCHC: 32 g/dL (ref 30.0–36.0)
MCV: 88.3 fL (ref 80.0–100.0)
Platelets: 265 K/uL (ref 150–400)
RBC: 4.96 MIL/uL (ref 3.87–5.11)
RDW: 12.6 % (ref 11.5–15.5)
WBC: 10.9 K/uL — ABNORMAL HIGH (ref 4.0–10.5)
nRBC: 0 % (ref 0.0–0.2)

## 2023-11-03 LAB — TROPONIN I (HIGH SENSITIVITY)
Troponin I (High Sensitivity): 4 ng/L (ref <18)
Troponin I (High Sensitivity): 4 ng/L (ref <18)

## 2023-11-03 LAB — COMPREHENSIVE METABOLIC PANEL WITH GFR
ALT: 15 U/L (ref 0–44)
AST: 16 U/L (ref 15–41)
Albumin: 4.1 g/dL (ref 3.5–5.0)
Alkaline Phosphatase: 173 U/L — ABNORMAL HIGH (ref 38–126)
Anion gap: 12 (ref 5–15)
BUN: 15 mg/dL (ref 6–20)
CO2: 26 mmol/L (ref 22–32)
Calcium: 9.4 mg/dL (ref 8.9–10.3)
Chloride: 104 mmol/L (ref 98–111)
Creatinine, Ser: 0.72 mg/dL (ref 0.44–1.00)
GFR, Estimated: >60 mL/min (ref >60)
Glucose, Bld: 127 mg/dL — ABNORMAL HIGH (ref 70–99)
Potassium: 3.9 mmol/L (ref 3.5–5.1)
Sodium: 142 mmol/L (ref 135–145)
Total Bilirubin: 0.7 mg/dL (ref 0.0–1.2)
Total Protein: 7.7 g/dL (ref 6.5–8.1)

## 2023-11-03 LAB — LIPASE, BLOOD: Lipase: 32 U/L (ref 11–51)

## 2023-11-03 MED ORDER — DOXYCYCLINE HYCLATE 100 MG PO TABS
100.0000 mg | ORAL_TABLET | Freq: Once | ORAL | Status: AC
  Administered 2023-11-03: 100 mg via ORAL
  Filled 2023-11-03: qty 1

## 2023-11-03 MED ORDER — ALUM & MAG HYDROXIDE-SIMETH 200-200-20 MG/5ML PO SUSP
30.0000 mL | Freq: Once | ORAL | Status: AC
  Administered 2023-11-03: 30 mL via ORAL
  Filled 2023-11-03: qty 30

## 2023-11-03 MED ORDER — ONDANSETRON 4 MG PO TBDP: 4.0000 mg | ORAL_TABLET | Freq: Three times a day (TID) | ORAL | 0 refills | Status: DC | PRN

## 2023-11-03 MED ORDER — DOXYCYCLINE HYCLATE 100 MG PO TABS: 100.0000 mg | ORAL_TABLET | Freq: Two times a day (BID) | ORAL | 0 refills | Status: DC

## 2023-11-03 MED ORDER — FAMOTIDINE IN NACL 20-0.9 MG/50ML-% IV SOLN
20.0000 mg | Freq: Once | INTRAVENOUS | Status: AC
  Administered 2023-11-03: 20 mg via INTRAVENOUS
  Filled 2023-11-03: qty 50

## 2023-11-03 MED ORDER — IOHEXOL 300 MG/ML  SOLN
100.0000 mL | Freq: Once | INTRAMUSCULAR | Status: AC | PRN
  Administered 2023-11-03: 100 mL via INTRAVENOUS

## 2023-11-03 MED ORDER — ONDANSETRON HCL 4 MG/2ML IJ SOLN
4.0000 mg | Freq: Once | INTRAMUSCULAR | Status: AC
  Administered 2023-11-03: 4 mg via INTRAVENOUS
  Filled 2023-11-03: qty 2

## 2023-11-03 MED ORDER — FAMOTIDINE 20 MG PO TABS: 20.0000 mg | ORAL_TABLET | Freq: Every day | ORAL | 1 refills | Status: DC

## 2023-11-03 MED ORDER — LIDOCAINE VISCOUS HCL 2 % MT SOLN
15.0000 mL | Freq: Once | OROMUCOSAL | Status: AC
  Administered 2023-11-03: 15 mL via OROMUCOSAL
  Filled 2023-11-03: qty 15

## 2023-11-03 MED ORDER — DOXYCYCLINE HYCLATE 100 MG PO TABS: 100.0000 mg | ORAL_TABLET | Freq: Two times a day (BID) | ORAL | 0 refills | Status: AC

## 2023-11-03 NOTE — ED Triage Notes (Signed)
 Pt comes in today via pov with complaints of left upper gastric abdominal pain that started this morning. Pt states that she feels as if she needs to vomit, but nothing is happening. Pt complains of pain 6/10 at this time.

## 2023-11-03 NOTE — ED Notes (Signed)
Pt states that she is feeling much better now.  

## 2023-11-03 NOTE — Discharge Instructions (Addendum)
 Start taking famotidine /Pepcid  once per day every day to help with acid from the stomach  Zofran  as needed nausea medicine  Call the surgeon to be seen in the clinic to discuss surgery for hernia repair  Return to the ED with any worsening symptoms in the meantime  Doxycycline  antibiotics twice daily for 10 days due to the rash on your shoulder

## 2023-11-03 NOTE — ED Provider Notes (Signed)
 [facility] Provider Note    Event Date/Time   First MD Initiated Contact with Patient 11/03/23 1033     (approximate)   History   Abdominal Pain   HPI  Amanda Ashley is a 53 y.o. female who presents to the ED for evaluation of Abdominal Pain   Review of routine PCP visit from last month.  Obese patient with history of GERD and cholecystectomy.  Patient presents for evaluation of LUQ abdominal pain and nausea over the past couple days.  No stool changes  Also reporting a bulge that she can feel to the left side of her bellybutton that is tender to palpation  Also reporting a rash around a bug bite to the posterior aspect of her right shoulder.  Reports her mother told her that she had a bug on her 7 days ago.  Patient reports looking in the mirror and noticing the rash in the past couple days.   Physical Exam   Triage Vital Signs: ED Triage Vitals  Encounter Vitals Group     BP --      Girls Systolic BP Percentile --      Girls Diastolic BP Percentile --      Boys Systolic BP Percentile --      Boys Diastolic BP Percentile --      Pulse --      Resp --      Temp --      Temp src --      SpO2 --      Weight 11/03/23 0951 229 lb (103.9 kg)     Height 11/03/23 0951 5' 5 (1.651 m)     Head Circumference --      Peak Flow --      Pain Score 11/03/23 0950 6     Pain Loc --      Pain Education --      Exclude from Growth Chart --     Most recent vital signs: Vitals:   11/03/23 1104 11/03/23 1303  BP: 136/81 (!) 134/92  Pulse: 81 75  Resp: 16 16  Temp: 98.5 F (36.9 C)   SpO2: 100% 100%    General: Awake, no distress.  Obese patient, well-appearing CV:  Good peripheral perfusion.  Resp:  Normal effort.  Abd:  No distention.  Mild LUQ tenderness without guarding or peritoneal features.  Minimally palpable soft tissue mass near the umbilicus suspicious for a hernia. MSK:  No deformity noted.  Neuro:  No focal deficits  appreciated. Other:     ED Results / Procedures / Treatments   Labs (all labs ordered are listed, but only abnormal results are displayed) Labs Reviewed  COMPREHENSIVE METABOLIC PANEL WITH GFR - Abnormal; Notable for the following components:      Result Value   Glucose, Bld 127 (*)    Alkaline Phosphatase 173 (*)    All other components within normal limits  CBC - Abnormal; Notable for the following components:   WBC 10.9 (*)    All other components within normal limits  URINALYSIS, ROUTINE W REFLEX MICROSCOPIC - Abnormal; Notable for the following components:   Color, Urine YELLOW (*)    APPearance CLEAR (*)    Hgb urine dipstick SMALL (*)    Bacteria, UA RARE (*)    All other components within normal limits  LIPASE, BLOOD  TROPONIN I (HIGH SENSITIVITY)  TROPONIN I (HIGH SENSITIVITY)    EKG Sinus rhythm with a rate  of 80 bpm.  Normal axis and intervals without signs of acute ischemia.  RADIOLOGY CT abdomen/pelvis interpreted by me with a hiatal hernia and small incisional hernia without obstructive features  Official radiology report(s): CT ABDOMEN PELVIS W CONTRAST Result Date: 11/03/2023 CLINICAL DATA:  Left periumbilical abdominal pain. Clinical concern for a ventral hernia. Previous cholecystectomy. EXAM: CT ABDOMEN AND PELVIS WITH CONTRAST TECHNIQUE: Multidetector CT imaging of the abdomen and pelvis was performed using the standard protocol following bolus administration of intravenous contrast. RADIATION DOSE REDUCTION: This exam was performed according to the departmental dose-optimization program which includes automated exposure control, adjustment of the mA and/or kV according to patient size and/or use of iterative reconstruction technique. CONTRAST:  OMNIPAQUE  IOHEXOL  300 MG/ML  SOLN COMPARISON:  Right upper quadrant abdomen ultrasound dated 02/02/2022 FINDINGS: Lower chest: Large hiatal hernia. Normal sized heart. 3 mm right middle lobe subpleural nodule on  image number 5/4 3 mm subpleural nodule on image number 13/4. Small number of additional 2-3 mm subpleural nodules in the left lower lobe. Hepatobiliary: No focal liver abnormality is seen. Status post cholecystectomy. No biliary dilatation. Pancreas: Unremarkable. No pancreatic ductal dilatation or surrounding inflammatory changes. Spleen: Normal in size without focal abnormality. Adrenals/Urinary Tract: Adrenal glands are unremarkable. Kidneys are normal, without renal calculi, focal lesion, or hydronephrosis. Bladder is unremarkable. Stomach/Bowel: Large hiatal hernia. Unremarkable small bowel and colon. No evidence of appendicitis. Vascular/Lymphatic: Choose wound Reproductive: Uterus and bilateral adnexa are unremarkable. Other: Small supraumbilical incisional hernia containing a small bowel loop extending to the left without bowel wall thickening, pneumatosis or dilatation. Musculoskeletal: Lower thoracic spine degenerative changes. IMPRESSION: 1. Small supraumbilical incisional hernia containing a small bowel loop extending to the left without evidence of obstruction or strangulation. 2. Large hiatal hernia. 3. Small number of 2-3 mm subpleural nodules in both lung bases. Per Fleischner Society Guidelines, no routine follow-up imaging is recommended. Electronically Signed   By: Elspeth Bathe M.D.   On: 11/03/2023 12:47    PROCEDURES and INTERVENTIONS:  Procedures  Medications  famotidine  (PEPCID ) IVPB 20 mg premix (0 mg Intravenous Stopped 11/03/23 1226)  ondansetron  (ZOFRAN ) injection 4 mg (4 mg Intravenous Given 11/03/23 1053)  alum & mag hydroxide-simeth (MAALOX/MYLANTA) 200-200-20 MG/5ML suspension 30 mL (30 mLs Oral Given 11/03/23 1056)  lidocaine  (XYLOCAINE ) 2 % viscous mouth solution 15 mL (15 mLs Mouth/Throat Given 11/03/23 1056)  doxycycline  (VIBRA -TABS) tablet 100 mg (100 mg Oral Given 11/03/23 1056)  iohexol  (OMNIPAQUE ) 300 MG/ML solution 100 mL (100 mLs Intravenous Contrast Given 11/03/23  1222)     IMPRESSION / MDM / ASSESSMENT AND PLAN / ED COURSE  I reviewed the triage vital signs and the nursing notes.  Differential diagnosis includes, but is not limited to, gastritis, GERD, pancreatitis, choledocholithiasis, SBO, incarcerated hernia  {Patient presents with symptoms of an acute illness or injury that is potentially life-threatening.  Patient presents with LUQ abdominal pain, likely gastritis from a hiatal hernia.  She is also shows me a bubble that she feels in her abdomen near her umbilicus that is a small incisional hernia on CT.  Reducible and without obstructive features.  Blood work is reassuring with marginal leukocytosis, normal lipase, metabolic panel, troponin and UA.  Discharged with H2 blocker and surgical referral.  Discussed ED return precautions.  Clinical Course as of 11/03/23 1313  Thu Nov 03, 2023  1304 Reassessed, patient feeling much better.  We discussed hiatal hernia, gastritis and GERD.  Also discussed incisional hernia without obstructive features.  We discussed surgical evaluation as an outpatient, expectant management and ED return precautions. [DS]    Clinical Course User Index [DS] Claudene Rover, MD     FINAL CLINICAL IMPRESSION(S) / ED DIAGNOSES   Final diagnoses:  Acute gastritis without hemorrhage, unspecified gastritis type  Hiatal hernia  Incisional hernia, without obstruction or gangrene     Rx / DC Orders   ED Discharge Orders          Ordered    famotidine  (PEPCID ) 20 MG tablet  Daily        11/03/23 1307    ondansetron  (ZOFRAN -ODT) 4 MG disintegrating tablet  Every 8 hours PRN        11/03/23 1307             Note:  This document was prepared using Dragon voice recognition software and may include unintentional dictation errors.   Claudene Rover, MD 11/03/23 [phone]

## 2023-11-03 NOTE — ED Notes (Signed)
Pt taken to ct via w/c with ct tech

## 2023-11-16 ENCOUNTER — Ambulatory Visit: Admitting: Surgery

## 2023-11-16 ENCOUNTER — Encounter: Payer: Self-pay | Admitting: Surgery

## 2023-11-16 VITALS — BP 118/79 | HR 80 | Ht 65.0 in | Wt 228.0 lb

## 2023-11-16 DIAGNOSIS — K439 Ventral hernia without obstruction or gangrene: Secondary | ICD-10-CM

## 2023-11-16 DIAGNOSIS — K449 Diaphragmatic hernia without obstruction or gangrene: Secondary | ICD-10-CM | POA: Diagnosis not present

## 2023-11-16 DIAGNOSIS — K436 Other and unspecified ventral hernia with obstruction, without gangrene: Secondary | ICD-10-CM

## 2023-11-16 NOTE — Patient Instructions (Addendum)
 You have requested to have a Ventral Hernia Repair. This will be done by Dr Jordis at [facility]. Please see your (BLUE) Pre-care sheet for more information. You will receive several calls from [facility] services about your surgery.   You will need to arrange to be out of work for approximately 1-2 weeks and then you may return with a lifting restriction for 4 more weeks. If you have FMLA or Disability paperwork that needs to be filled out, please have your company fax your paperwork to [phone] or you may drop this by either office. This paperwork will be filled out within 3 days after your surgery has been completed.     Ciruga laparoscpica para las hernias en el vientre: qu esperar Laparoscopic Surgery for Belly Hernias: What to Expect  La ciruga laparoscpica para las hernias en el vientre es un procedimiento que se hace para tratar un bulto de tejido que sale a travs de una zona dbil del vientre (hernia ventral). Se le puede realizar este procedimiento de inmediato si parte del intestino queda atrapado dentro de la hernia y comienza a perder su irrigacin sangunea (estrangulacin). La ciruga laparoscpica se hace a travs de pequeos cortes usando un endoscopio que tiene una luz y una cmara (laparoscopio). Informe al mdico acerca de lo siguiente: Cualquier alergia que tenga. Todos los [employer] toma. Estos incluyen vitaminas, hierbas, gotas oftlmicas, cremas y [address] de [address]. Cualquier problema que usted o los [employer] de su familia hayan tenido con el uso de anestesia. Cualquier problema de sangrado que tenga. Cirugas a las que se haya sometido. Cualquier afeccin mdica que tenga. Si est embarazada o podra estarlo. Cules son los riesgos? El mdico hablar con usted [employer]. Pueden incluir: Una infeccin. Sangrado o cogulos de sangre. Daos a las Building services engineer. Dificultad para defecar u orinar. La hernia regresa luego  de la [place]. Alergia a la [place], si se alethia query [place]. Acumulacin de lquido en la zona de la hernia. En algunos casos, el mdico puede tener que Elida de un procedimiento laparoscpico a un procedimiento que se realiza a travs de una nica incisin grande en el vientre (procedimiento abierto). El procedimiento abierto puede ser [employer] en los siguientes casos: Tiene una hernia que es difcil de Therapist, sports. Es difcil ver los rganos con el laparoscopio. Tiene problemas de sangrado durante el procedimiento laparoscpico. Michelina ocurre antes del procedimiento? Medicamentos Consulte si debe cambiar o suspender lo siguiente: Los medicamentos que usa . Cualquier vitamina o suplemento que tome. No tome aspirina ni ibuprofeno a menos que se lo indiquen. Pruebas Pueden hacerle un examen o pruebas, por ejemplo: Anlisis de [place]. Anlisis de comoros. Ecografa del vientre. Radiografa de trax. Electrocardiograma (ECG). Seguridad en la ciruga Por su seguridad, es posible que: Deba lavarse la piel con un jabn germicida. Reciba antibiticos. Le marquen el lugar de la [place]. Le rasuren el vello del lugar de la ciruga. Instrucciones generales No fume, vapee ni consuma nicotina o tabaco durante al menos 4 semanas antes de la [place]. Pregunte si pasar la noche en el hospital. Si va a marcharse a su casa inmediatamente despus del procedimiento, pdale a un adulto responsable que: Lo lleve a su casa desde el hospital o la clnica. No se le permitir conducir. Se quede con usted durante el [employer] indiquen. Qu ocurre durante el procedimiento?  Le pondrn una va intravenosa en una vena de la mano o el brazo. Le administrarn lo siguiente: Un sedante  para ayudarlo a relajarse. Anestesia para Industrial/product designer. Le harn muchos cortes pequeos (incisiones) en el vientre. Le bombearn gas en el vientre a travs de [employer] cortes. Esto le permitir al [employer] ver con  mayor facilidad el interior del vientre durante la reparacin. Le introducirn un laparoscopio en el vientre. Este enviar una imagen a un monitor que se encuentra en el quirfano. Los instrumentos necesarios para la [place] se Clinical biochemist a travs de los otros cortes. Los tejidos o el intestino que conforman la hernia se colocarn nuevamente en su posicin normal. Los bordes de la hernia se pueden coser juntos. Puede usarse una malla para cerrar la hernia. Se usarn puntos, clips o grapas para Child psychotherapist. Los cortes se cerrarn con puntos, goma para cerrar la piel o tiras de cinta adhesiva. Posiblemente se los cubrirn con una venda. Estos pasos pueden variar. Pregunte cmo ser. Qu ocurre despus del procedimiento? Estar bajo observacin atenta hasta que se vaya. Esto incluye verificar el nivel de dolor, la presin arterial, la frecuencia cardaca y la frecuencia respiratoria. Seguir recibiendo lquidos y medicamentos a travs de una va intravenosa. Le retirarn la va intravenosa cuando pueda beber lquidos transparentes. Esta informacin no tiene Theme park manager el consejo del mdico. Asegrese de hacerle al mdico cualquier pregunta que tenga. Document Revised: 09/23/2022 Document Reviewed: 09/23/2022 Elsevier Patient Education  2024 Elsevier Inc.  Hernia de hiato Hiatal Hernia  Una hernia de hiato se produce cuando parte del estmago se desliza por arriba del msculo que separa el abdomen del trax (diafragma). [place] persona puede nacer con una hernia de hiato (congnita), o esta puede formarse con Museum/gallery conservator. En casi todos los casos de hernia de hiato, solo la parte superior del estmago se protruye del diafragma. Muchas personas tienen hernia de hiato asintomtica. Cuanto ms grande es la hernia, ms probabilidades hay de que cause sntomas. En algunos casos, una hernia de hiato permite que el cido estomacal retorne al tubo que lleva la comida desde la boca al  estmago (esfago). Esto puede provocar sntomas de Merchant navy officer. El desarrollo de los sntomas de palau estomacal puede significar que usted experimenta una afeccin llamada enfermedad de reflujo gastroesofgico (ERGE). Cules son las causas? Esta afeccin se debe a una debilidad en el orificio (hiato) donde el esfago pasa a travs del diafragma y se une a la parte superior del [place]. [place] persona puede nacer con una debilidad en el hiato o la debilidad puede aparecer con el tiempo. Qu incrementa el riesgo? Es ms probable que Dietitian en: Personas de edad avanzada. La edad es un factor de riesgo importante para la hernia de hiato, especialmente despus de los 50 aos de edad. Las [employer]. Las personas con sobrepeso. Personas con estreimiento frecuente. Cules son los signos o sntomas? Los sntomas de esta afeccin generalmente aparecen como sntomas de ERGE. Algunos de los sntomas son los siguientes: Merchant navy officer. Malestar estomacal (indigestin). Dificultad para tragar. Tos o sibilancias. Las sibilancias son sonidos agudos como silbidos al Industrial/product designer. Dolor de Advertising copywriter. Dolor en el pecho. Nuseas y vmitos. Cmo se diagnostica? Esta afeccin puede diagnosticarse durante las pruebas de la [place]. Podrn solicitarle otros estudios, por ejemplo: Radiografas de estmago o de trax. Trnsito esofagogastroduodenal. Este es un examen radiogrfico del tracto gastrointestinal (GI) que se obtiene despus de que usted toma un lquido terroso que se ve claramente en la radiografa. Endoscopa. Este es un procedimiento para Psychologist, prison and probation services interior del  estmago mediante el uso de un tubo delgado y flexible que tiene una pequea cmara y ignacia kos en el extremo. Cmo se trata? El tratamiento para esta afeccin puede incluir lo siguiente: Cambios en la dieta y el estilo de vida para ayudar a disminuir los sntomas de [place]. Medicamentos. Pueden  incluir: Anticidos de [employer]. Medicamentos que ayudan a Teaching laboratory technician con mayor rapidez. Medicamentos que inhiben la produccin de cido estomacal (antagonistas de los receptores H2). Medicamentos ms potentes para reducir la cantidad de cido estomacal (inhibidores de la bomba de protones). Ciruga para reparar la hernia si otros tratamientos no son eficaces. Si no tiene sntomas, tal vez no necesite tratamiento. Siga estas indicaciones en su casa: Estilo de vida y [place] No consuma ningn producto que contenga nicotina o tabaco. Estos productos incluyen cigarrillos, tabaco para Theatre manager y aparatos de vapeo, como los cigarrillos electrnicos. Si necesita ayuda para dejar de consumir estos productos, consulte al [employer]. Intente llegar a un peso corporal saludable y [place]. Evite ejercer presin sobre el abdomen. Cualquier cosa que ejerza presin sobre el abdomen aumenta la cantidad de cido que puede retornar al esfago. No se agache, especialmente despus de comer. Eleve la cabecera de la cama con bloques debajo de las patas. Esto mantiene la cabeza y el esfago ms elevados que el [place]. No use prendas apretadas alrededor del pecho o del estmago. Intente no hacer fuerza al defecar, al orinar o al levantar objetos pesados. Comida y bebida Evite los alimentos que pueden intensificar los sntomas de [place]. Pueden incluir: Alimentos grasos, como los alimentos fritos. Frutas ctricas, como naranjas o limones. Otros alimentos y bebidas que contengan cido, como jugo de naranja o tomates. Comidas condimentadas. Chocolate. Ingiera comidas pequeas con frecuencia en lugar de tres comidas abundantes por da. Esto ayuda a Environmental manager se llene demasiado. Coma lentamente. No se acueste enseguida despus de comer. No coma durante 1 o 2 horas antes de acostarse. No tome bebidas que contengan cafena. Estas incluyen bebidas cola, caf, cacao y t. No beba  alcohol. Indicaciones generales Use los medicamentos de venta libre y los recetados solamente como se lo haya indicado el mdico. Concurra a todas las visitas de seguimiento. El mdico querr verificar si los medicamentos nuevos recetados estn ayudando a Paramedic los sntomas. Comunquese con un mdico si: No es posible controlar los sntomas con medicamentos o cambios en el estilo de vida. Tiene dificultad para tragar. Tiene tos o sibilancias que no desaparecen. El dolor [place]. El dolor se irradia a los [place], el cuello, la [place], los dientes o la espalda. Siente nuseas o vomita. Solicite ayuda de inmediato si: Le falta el aire. Vomita sangre. Observa sangre brillante en las heces. Las heces son negras, de aspecto alquitranado. Estos sntomas pueden Customer service manager. Solicite ayuda de inmediato. Llame al 911. No espere a ver si los sntomas desaparecen. No conduzca por sus propios medios [employer] hospital. Resumen Una hernia de hiato se produce cuando parte del estmago se desliza por arriba del msculo que separa el abdomen del trax. [place] persona puede nacer con una debilidad en el hiato o la debilidad puede aparecer con el tiempo. Los sntomas de la hernia de hiato pueden incluir Merchant navy officer, dificultad para tragar o dolor de garganta. El control de la hernia de hiato incluye ingerir comidas pequeas con frecuencia en lugar de tres comidas grandes al da. Obtenga ayuda de inmediato si vomita sangre, tiene sangre roja [employer] o tiene heces  negras y alquitranadas. Esta informacin no tiene Theme park manager el consejo del mdico. Asegrese de hacerle al mdico cualquier pregunta que tenga. Document Revised: 04/23/2021 Document Reviewed: 04/23/2021 Elsevier Patient Education  2024 [employer].

## 2023-11-17 ENCOUNTER — Telehealth: Payer: Self-pay | Admitting: Surgery

## 2023-11-17 NOTE — H&P (View-Only) (Signed)
 Patient ID: Amanda Ashley, female   DOB: 03-10-1970, 53 y.o.   MRN: [id number]  HPI Amanda Ashley is a 53 y.o. female seen in consultation at the request of Dr. Claudene.  She did have a cholecystectomy by me close to 2 years ago for acute cholecystitis.  She recovered well.  Recently came to the emergency room due to severe abdominal pain located in the left upper quadrant, it was moderate to severe sharp and associated with a bulge.  No fevers or chills.  She did have nausea CT scan personally reviewed showing evidence of ventral hernia with small bowel within the sac there is a large hiatal hernia as well. Her symptoms resolved after she was given IV fluids and IV narcotics. Recent CBC and CMP were normal except mildly chronically elevated alkaline phosphatase Her surgical history includes laparoscopic cholecystectomy and laparoscopic tubal ligation   HPI  Past Medical History:  Diagnosis Date   Acid reflux    Arrhythmia     Past Surgical History:  Procedure Laterality Date   CHOLECYSTECTOMY N/A 02/03/2022   Procedure: LAPAROSCOPIC CHOLECYSTECTOMY;  Surgeon: Jordis Laneta FALCON, MD;  Location: [facility] ORS;  Service: General;  Laterality: N/A;   OOPHORECTOMY Right    TUBAL LIGATION      Family History  Problem Relation Age of Onset   Breast cancer Maternal Aunt     Social History Social History   Tobacco Use   Smoking status: Never    Passive exposure: Never   Smokeless tobacco: Never  Vaping Use   Vaping status: Never Used  Substance Use Topics   Alcohol use: No   Drug use: No    No Known Allergies  No current outpatient medications on file.   No current facility-administered medications for this visit.     Review of Systems Full ROS  was asked and was negative except for the information on the HPI  Physical Exam Blood pressure 118/79, pulse 80, height 5' 5 (1.651 m), weight 228 lb (103.4 kg), SpO2 96%. CONSTITUTIONAL: NAD. EYES: Pupils are equal, round,  Sclera are non-icteric. EARS, NOSE, MOUTH AND THROAT: The oropharynx is clear. The oral mucosa is pink and moist. Hearing is intact to voice. LYMPH NODES:  Lymph nodes in the neck are normal. RESPIRATORY:  Lungs are clear. There is normal respiratory effort, with equal breath sounds bilaterally, and without pathologic use of accessory muscles. CARDIOVASCULAR: Heart is regular without murmurs, gallops, or rubs. GI: The abdomen is  soft, nontender, and nondistended.  Ventral supraumbilical hernia 3 cms, partially reducible. There are no palpable masses. There is no hepatosplenomegaly. There are normal bowel sounds . GU: Rectal deferred.   MUSCULOSKELETAL: Normal muscle strength and tone. No cyanosis or edema.   SKIN: Turgor is good and there are no pathologic skin lesions or ulcers. NEUROLOGIC: Motor and sensation is grossly normal. Cranial nerves are grossly intact. PSYCH:  Oriented to person, place and time. Affect is normal.  Data Reviewed I have personally reviewed the patient's imaging, laboratory findings and medical records.    Assessment/Plan 53 year old female with symptomatic ventral hernia.  In addition she does have a large paraesophageal hernia. Given recent evidence of likely transient incarceration of the ventral hernia I do think that we need to prioritize repair of ventral hernia.  I do think that she will be a good candidate for robotic approach.  Procedure discussed with the patient in detail.  The risk, benefits and possible implications including but not limited  to bleeding, infection, bowel injuries, recurrence and chronic pain were discussed with the patient.  I do think that at some point in time paraesophageal hernia will need to be repaired however her BMI is close to 38 and we still need work to do.  This will also manage further workup in the form of endoscopy and swallow evaluation. For now we will concentrate on repairing the ventral hernia and then going from there I  personally spent a total of 55 minutes in the care of the patient today including performing a medically appropriate exam/evaluation, counseling and educating, placing orders, referring and communicating with other health care professionals, documenting clinical information in the EHR, independently interpreting and reviewing images studies and coordinating care.  A copy of this report was sent to the referring provider    Laneta Luna, MD FACS General Surgeon 11/17/2023, 1:54 PM

## 2023-11-17 NOTE — Telephone Encounter (Signed)
 Patient has been advised of Pre-Admission date/time, and Surgery date at [facility].  Surgery Date: 12/01/23 Preadmission Testing Date: 11/25/23 (phone 1p-4p)  Patient informed of the scheduling process and surgery information given at time of office visit.  Patient has been made aware to call [phone], between 1-3:00pm the day before surgery, to find out what time to arrive for surgery.

## 2023-11-17 NOTE — Progress Notes (Signed)
 Patient ID: Amanda Ashley, female   DOB: 03-10-1970, 53 y.o.   MRN: [id number]  HPI Amanda Ashley is a 53 y.o. female seen in consultation at the request of Dr. Claudene.  She did have a cholecystectomy by me close to 2 years ago for acute cholecystitis.  She recovered well.  Recently came to the emergency room due to severe abdominal pain located in the left upper quadrant, it was moderate to severe sharp and associated with a bulge.  No fevers or chills.  She did have nausea CT scan personally reviewed showing evidence of ventral hernia with small bowel within the sac there is a large hiatal hernia as well. Her symptoms resolved after she was given IV fluids and IV narcotics. Recent CBC and CMP were normal except mildly chronically elevated alkaline phosphatase Her surgical history includes laparoscopic cholecystectomy and laparoscopic tubal ligation   HPI  Past Medical History:  Diagnosis Date   Acid reflux    Arrhythmia     Past Surgical History:  Procedure Laterality Date   CHOLECYSTECTOMY N/A 02/03/2022   Procedure: LAPAROSCOPIC CHOLECYSTECTOMY;  Surgeon: Jordis Laneta FALCON, MD;  Location: [facility] ORS;  Service: General;  Laterality: N/A;   OOPHORECTOMY Right    TUBAL LIGATION      Family History  Problem Relation Age of Onset   Breast cancer Maternal Aunt     Social History Social History   Tobacco Use   Smoking status: Never    Passive exposure: Never   Smokeless tobacco: Never  Vaping Use   Vaping status: Never Used  Substance Use Topics   Alcohol use: No   Drug use: No    No Known Allergies  No current outpatient medications on file.   No current facility-administered medications for this visit.     Review of Systems Full ROS  was asked and was negative except for the information on the HPI  Physical Exam Blood pressure 118/79, pulse 80, height 5' 5 (1.651 m), weight 228 lb (103.4 kg), SpO2 96%. CONSTITUTIONAL: NAD. EYES: Pupils are equal, round,  Sclera are non-icteric. EARS, NOSE, MOUTH AND THROAT: The oropharynx is clear. The oral mucosa is pink and moist. Hearing is intact to voice. LYMPH NODES:  Lymph nodes in the neck are normal. RESPIRATORY:  Lungs are clear. There is normal respiratory effort, with equal breath sounds bilaterally, and without pathologic use of accessory muscles. CARDIOVASCULAR: Heart is regular without murmurs, gallops, or rubs. GI: The abdomen is  soft, nontender, and nondistended.  Ventral supraumbilical hernia 3 cms, partially reducible. There are no palpable masses. There is no hepatosplenomegaly. There are normal bowel sounds . GU: Rectal deferred.   MUSCULOSKELETAL: Normal muscle strength and tone. No cyanosis or edema.   SKIN: Turgor is good and there are no pathologic skin lesions or ulcers. NEUROLOGIC: Motor and sensation is grossly normal. Cranial nerves are grossly intact. PSYCH:  Oriented to person, place and time. Affect is normal.  Data Reviewed I have personally reviewed the patient's imaging, laboratory findings and medical records.    Assessment/Plan 53 year old female with symptomatic ventral hernia.  In addition she does have a large paraesophageal hernia. Given recent evidence of likely transient incarceration of the ventral hernia I do think that we need to prioritize repair of ventral hernia.  I do think that she will be a good candidate for robotic approach.  Procedure discussed with the patient in detail.  The risk, benefits and possible implications including but not limited  to bleeding, infection, bowel injuries, recurrence and chronic pain were discussed with the patient.  I do think that at some point in time paraesophageal hernia will need to be repaired however her BMI is close to 38 and we still need work to do.  This will also manage further workup in the form of endoscopy and swallow evaluation. For now we will concentrate on repairing the ventral hernia and then going from there I  personally spent a total of 55 minutes in the care of the patient today including performing a medically appropriate exam/evaluation, counseling and educating, placing orders, referring and communicating with other health care professionals, documenting clinical information in the EHR, independently interpreting and reviewing images studies and coordinating care.  A copy of this report was sent to the referring provider    Laneta Luna, MD FACS General Surgeon 11/17/2023, 1:54 PM

## 2023-11-25 ENCOUNTER — Encounter
Admission: RE | Admit: 2023-11-25 | Discharge: 2023-11-25 | Disposition: A | Discharge status: Home / Self Care | Source: Ambulatory Visit | Attending: Surgery | Admitting: Surgery

## 2023-11-25 ENCOUNTER — Other Ambulatory Visit: Payer: Self-pay

## 2023-11-25 DIAGNOSIS — Z01818 Encounter for other preprocedural examination: Secondary | ICD-10-CM

## 2023-11-25 HISTORY — DX: Type 2 diabetes mellitus without complications: E11.9

## 2023-11-25 HISTORY — DX: Cardiac murmur, unspecified: R01.1

## 2023-11-25 HISTORY — DX: Family history of other specified conditions: Z84.89

## 2023-11-25 HISTORY — DX: Ventral hernia without obstruction or gangrene: K43.9

## 2023-11-25 HISTORY — DX: Abnormal levels of other serum enzymes: R74.8

## 2023-11-25 HISTORY — DX: Diaphragmatic hernia without obstruction or gangrene: K44.9

## 2023-11-25 HISTORY — DX: Calculus of gallbladder and bile duct with acute cholecystitis without obstruction: K80.62

## 2023-11-25 NOTE — Patient Instructions (Signed)
 Preparing for Surgery with CHLORHEXIDINE GLUCONATE (CHG) Soap  Chlorhexidine Gluconate (CHG) Soap  o An antiseptic cleaner that kills germs and bonds with the skin to continue killing germs even after washing  o Used for showering the night before surgery and morning of surgery  Before surgery, you can play an important role by reducing the number of germs on your skin.  CHG (Chlorhexidine gluconate) soap is an antiseptic cleanser which kills germs and bonds with the skin to continue killing germs even after washing.  Please do not use if you have an allergy to CHG or antibacterial soaps. If your skin becomes reddened/irritated stop using the CHG.  1. Shower the NIGHT BEFORE SURGERY with CHG soap.  2. If you choose to wash your hair, wash your hair first as usual with your normal shampoo.  3. After shampooing, rinse your hair and body thoroughly to remove the shampoo.  4. Use CHG as you would any other liquid soap. You can apply CHG directly to the skin and wash gently with a clean washcloth.  5. Apply the CHG soap to your body only from the neck down. Do not use on open wounds or open sores. Avoid contact with your eyes, ears, mouth, and genitals (private parts). Wash face and genitals (private parts) with your normal soap.  6. Wash thoroughly, paying special attention to the area where your surgery will be performed.  7. Thoroughly rinse your body with warm water.  8. Do not shower/wash with your normal soap after using and rinsing off the CHG soap.  9. Do not use lotions, oils, etc., after showering with CHG.  10. Pat yourself dry with a clean towel.  11. Wear clean pajamas to bed the night before surgery.  12. Place clean sheets on your bed the night of your shower and do not sleep with pets.  13. Do not apply any deodorants/lotions/powders.  14. Please wear clean  clothes to the hospital.  15. Remember to brush your teeth with your regular toothpaste.   Preparacin para la Ciruga con Jabn de Gluconato de Clorhexidina (CHG)  Jabn de Gluconato de Clorhexidina (CHG)  o Un limpiador antisptico que elimina los grmenes y se adhiere a la piel para continuar eliminndolos incluso despus del lavado.  o Se usa  para ducharse la noche anterior a la ciruga y la maana siguiente.  Antes de la [place], usted puede contribuir significativamente a reducir la cantidad de grmenes en su piel.  El jabn de Gluconato de Clorhexidina (CHG) es un limpiador antisptico que elimina los grmenes y se adhiere a la piel para continuar eliminndolos incluso despus del lavado.  No lo use si tiene alergia al CHG o a los jabones antibacterianos. Si su piel se enrojece o se irrita, suspenda el uso del CHG.  1. Dchese la NOCHE ANTES DE LA CIRUGA con jabn de CHG.  2. Si decide lavarse el cabello, lvelo primero como de costumbre con su champ habitual.  3. Despus de lavarse con champ, enjuague bien el cabello y el cuerpo para Risk manager.  4. Use el CHG como cualquier otro jabn lquido. Puede aplicar el CHG directamente sobre la piel y lavarse  suavemente con una toallita limpia.  5. Aplique el jabn CHG en el cuerpo solo del cuello para abajo. No lo use sobre [employer]. Evite el contacto con los ojos, odos, boca y genitales (partes ntimas). Lave la cara y los genitales (partes ntimas) con su jabn habitual.  6. Lvese bien, prestando especial atencin a la zona donde se Retail buyer.  7. Enjuague bien el cuerpo con agua tibia.  8. No se duche ni se lave con su jabn habitual despus de usar y enjuagar el jabn CHG.  9. No use lociones, aceites, etc., despus de ducharse con CHG.  10. Squese con palmaditas suaves con una toalla limpia.  11. Use pijama limpio para dormir la noche anterior a la ciruga.  12. Coloque sbanas limpias en  su cama la noche de su ducha y no duerma con mascotas.  13. No se aplique desodorante, locin ni polvos.  14. Por favor, use ropa limpia al hospital.  15. Recuerde cepillarse los dientes con su pasta dental habitual.

## 2023-11-25 NOTE — Patient Instructions (Addendum)
 Your procedure is scheduled on:12-01-23 Thursday Report to the Registration Desk on the 1st floor of the [facility].Then proceed to the 2nd floor Surgery Desk To find out your arrival time, please call [phone] between 1PM - 3PM on:11-30-23 Wednesday If your arrival time is 6:00 am, do not arrive before that time as the [facility] entrance doors do not open until 6:00 am.  REMEMBER: Instructions that are not followed completely may result in serious medical risk, up to and including death; or upon the discretion of your surgeon and anesthesiologist your surgery may need to be rescheduled.  Do not eat food after midnight the night before surgery.  No gum chewing or hard candies.  You may however, drink Water up to 2 hours before you are scheduled to arrive for your surgery. Do not drink anything within 2 hours of your scheduled arrival time.  One week prior to surgery:Stop NOW (11-25-23) Stop Anti-inflammatories (NSAIDS) such as Advil , Aleve, Ibuprofen , Motrin , Naproxen, Naprosyn and Aspirin based products such as Excedrin, Goody's Powder, BC Powder. Stop ANY OVER THE COUNTER supplements until after surgery.  You may however, continue to take Tylenol  if needed for pain up until the day of surgery.  Do NOT take any medication the day of surgery  No Alcohol for 24 hours before or after surgery.  No Smoking including e-cigarettes for 24 hours before surgery.  No chewable tobacco products for at least 6 hours before surgery.  No nicotine patches on the day of surgery.  Do not use any recreational drugs for at least a week (preferably 2 weeks) before your surgery.  Please be advised that the combination of cocaine and anesthesia may have negative outcomes, up to and including death. If you test positive for cocaine, your surgery will be cancelled.  On the morning of surgery brush your teeth with toothpaste and water, you may rinse your mouth with mouthwash if you wish. Do not  swallow any toothpaste or mouthwash.  Use CHG Soap as directed on instruction sheet.  Do not wear jewelry, make-up, hairpins, clips or nail polish.  For welded (permanent) jewelry: bracelets, anklets, waist bands, etc.  Please have this removed prior to surgery.  If it is not removed, there is a chance that hospital personnel will need to cut it off on the day of surgery.  Do not wear lotions, powders, or perfumes.   Do not shave body hair from the neck down 48 hours before surgery.  Contact lenses, hearing aids and dentures may not be worn into surgery.  Do not bring valuables to the hospital. [facility] is not responsible for any missing/lost belongings or valuables.   Notify your doctor if there is any change in your medical condition (cold, fever, infection).  Wear comfortable clothing (specific to your surgery type) to the hospital.  After surgery, you can help prevent lung complications by doing breathing exercises.  Take deep breaths and cough every 1-2 hours. Your doctor may order a device called an Incentive Spirometer to help you take deep breaths. When coughing or sneezing, hold a pillow firmly against your incision with both hands. This is called "splinting." Doing this helps protect your incision. It also decreases belly discomfort.  If you are being admitted to the hospital overnight, leave your suitcase in the car. After surgery it may be brought to your room.  In case of increased patient census, it may be necessary for you, the patient, to continue your postoperative care in the Same  Day Surgery department.  If you are being discharged the day of surgery, you will not be allowed to drive home. You will need a responsible individual to drive you home and stay with you for 24 hours after surgery.   If you are taking public transportation, you will need to have a responsible individual with you.  Please call the Pre-admissions Testing Dept. at [phone] if you  have any questions about these instructions.  Surgery Visitation Policy:  Patients having surgery or a procedure may have two visitors.  Children under the age of 19 must have an adult with them who is not the patient.   Su procedimiento est programado para el jueves 25 de septiembre de 2020. Presntese en el mostrador de Tax adviser del [employer] Luego, dirjase al mostrador de [place] en [employer] Para saber su hora de llegada, llame al [phone] entre la 1 p. m. y las 3 p. m. el mircoles 25 de agosto de 2020. Si su hora de llegada es a las 6:00 a. m., no llegue antes, ya que las puertas de [place] del [address] no abren [employer] 6:00 a. m.  RECUERDE: El incumplimiento de las instrucciones puede resultar en un riesgo mdico grave, incluso la [place]; o, a discrecin de su cirujano y Scientific laboratory technician, su ciruga podra tener que reprogramarse.  No consuma alimentos despus de la medianoche anterior a la ciruga.  No mastique chicle ni caramelos duros.  Sin embargo, puede beber [employer] 2 horas antes de su hora de llegada programada para la [place]. No beba nada dentro de las 2 horas previas a su hora de llegada programada.  Una semana antes de la ciruga: Suspenda AHORA (25/11/2017) Suspenda los antiinflamatorios (AINE) como Advil , Aleve, ibuprofeno, Motrin , naproxeno, Naprosyn y productos a base de aspirina como Excedrin, Goody's Powder y BC Powder. Suspenda cualquier suplemento de venta libre hasta despus de la ciruga.  Sin embargo, puede continuar tomando Tylenol  si lo necesita para Marketing executive de la [place].  NO tome ningn medicamento el da de la [place].  No consuma alcohol durante las 24 horas anteriores ni posteriores a Metallurgist.  No fume, incluidos los cigarrillos electrnicos, durante las 24 horas anteriores a la [place].  No consuma tabaco masticable durante al menos 6 horas antes de la [place].  No use parches de  nicotina el da de la [place].  No consuma drogas recreativas durante al menos una semana (preferiblemente 2 semanas) antes de la ciruga. Tenga en cuenta que la combinacin de cocana y anestesia puede tener consecuencias negativas, incluso la [place]. Si da positivo en la prueba de cocana, su ciruga ser cancelada.  La maana de la ciruga, cepllese los dientes con pasta dental y agua; puede enjuagarse la boca con enjuague bucal si lo desea. No ingiera pasta dental ni enjuague bucal.  Use jabn CHG segn las instrucciones.  No use joyas, maquillaje, horquillas, broches ni esmalte de uas.  Para joyas soldadas (permanentes): pulseras, tobilleras, fajas, etc., quteselas antes de la ciruga. Si no se las [place], es posible que el personal del hospital tenga que cortarlas el da de la [place].  No use lociones, polvos ni perfumes.  No se afeite el vello corporal del cuello para abajo 48 horas antes de la [place].  No se permiten lentes de contacto, audfonos ni dentaduras postizas durante la [place].  No traiga objetos de valor al hospital. [place] no se responsabiliza por la prdida  de pertenencias o objetos de valor.  Notifique a su mdico si observa algn cambio en su estado de salud (resfriado, fiebre, infeccin).  Omie al hospital con ropa cmoda (especfica para su tipo de [place]).  Despus de la [place], puede ayudar a prevenir complicaciones pulmonares realizando ejercicios de respiracin. Respire profundamente y tosa cada 1 o 2 horas. Su mdico podra solicitarle un dispositivo llamado espirmetro incentivador para ayudarle a respirar profundamente. Al toser o estornudar, sostenga una almohada firmemente contra la incisin con ambas manos. Esto se llama entablillado. Esto ayuda a Engineer, drilling incisin y tambin disminuye las molestias abdominales.  Si va a pasar la noche en el hospital, deje su maleta en el auto. Despus de la ciruga, podran llevarla a su  habitacin.  En caso de un aumento en el nmero de [place], podra ser necesario que usted, el [employer], contine su atencin postoperatoria en el departamento de Ciruga Ambulatoria.  Si le dan de alta el da de la [place], no podr conducir a casa. Necesitar que una persona responsable lo lleve a casa y lo acompae durante 24 horas despus de la [place].  Si utiliza transporte pblico, deber estar acompaado por una persona responsable.  Si tiene [employer], llame al [employer] de Preadmisin al [phone].  Poltica de Visitas a la Ciruga:  Los pacientes que se sometan a bosnia and [place] o procedimiento pueden [employer] visitantes.  Los [employer] de 16 aos deben estar acompaados por un adulto que no sea [place].

## 2023-11-25 NOTE — Progress Notes (Signed)
 Anesthesia interview done with [facility] # [phone]

## 2023-11-30 MED ORDER — ACETAMINOPHEN 500 MG PO TABS
1000.0000 mg | ORAL_TABLET | ORAL | Status: AC
  Administered 2023-12-01: 1000 mg via ORAL

## 2023-11-30 MED ORDER — CHLORHEXIDINE GLUCONATE 0.12 % MT SOLN
15.0000 mL | Freq: Once | OROMUCOSAL | Status: AC
Start: 2023-11-30 — End: 2023-12-01
  Administered 2023-12-01: 15 mL via OROMUCOSAL

## 2023-11-30 MED ORDER — CELECOXIB 200 MG PO CAPS
200.0000 mg | ORAL_CAPSULE | ORAL | Status: AC
  Administered 2023-12-01: 200 mg via ORAL

## 2023-11-30 MED ORDER — GABAPENTIN 300 MG PO CAPS
300.0000 mg | ORAL_CAPSULE | ORAL | Status: AC
  Administered 2023-12-01: 300 mg via ORAL

## 2023-11-30 MED ORDER — CEFAZOLIN SODIUM-DEXTROSE 2-4 GM/100ML-% IV SOLN
2.0000 g | INTRAVENOUS | Status: AC
  Administered 2023-12-01: 2 g via INTRAVENOUS

## 2023-11-30 MED ORDER — CHLORHEXIDINE GLUCONATE CLOTH 2 % EX PADS
6.0000 | MEDICATED_PAD | Freq: Once | CUTANEOUS | Status: AC
  Administered 2023-12-01: 6 via TOPICAL

## 2023-11-30 MED ORDER — CHLORHEXIDINE GLUCONATE CLOTH 2 % EX PADS: 6.0000 | MEDICATED_PAD | Freq: Once | CUTANEOUS | Status: DC

## 2023-11-30 MED ORDER — SODIUM CHLORIDE 0.9 % IV SOLN
INTRAVENOUS | Status: DC
Start: 2023-11-30 — End: 2023-12-01

## 2023-11-30 MED ORDER — ORAL CARE MOUTH RINSE: 15.0000 mL | Freq: Once | OROMUCOSAL | Status: AC

## 2023-12-01 ENCOUNTER — Other Ambulatory Visit: Payer: Self-pay

## 2023-12-01 ENCOUNTER — Ambulatory Visit
Admission: RE | Admit: 2023-12-01 | Discharge: 2023-12-01 | Disposition: A | Discharge status: Home / Self Care | Attending: Surgery | Admitting: Surgery

## 2023-12-01 ENCOUNTER — Encounter
Admission: RE | Disposition: A | Discharge status: Home / Self Care | Payer: Self-pay | Source: Home / Self Care | Attending: Surgery

## 2023-12-01 ENCOUNTER — Ambulatory Visit: Admitting: Registered Nurse

## 2023-12-01 ENCOUNTER — Encounter: Payer: Self-pay | Admitting: Surgery

## 2023-12-01 DIAGNOSIS — K436 Other and unspecified ventral hernia with obstruction, without gangrene: Secondary | ICD-10-CM

## 2023-12-01 DIAGNOSIS — E119 Type 2 diabetes mellitus without complications: Secondary | ICD-10-CM | POA: Diagnosis not present

## 2023-12-01 DIAGNOSIS — K439 Ventral hernia without obstruction or gangrene: Secondary | ICD-10-CM | POA: Insufficient documentation

## 2023-12-01 DIAGNOSIS — Z01818 Encounter for other preprocedural examination: Secondary | ICD-10-CM

## 2023-12-01 DIAGNOSIS — Z6837 Body mass index (BMI) 37.0-37.9, adult: Secondary | ICD-10-CM | POA: Insufficient documentation

## 2023-12-01 DIAGNOSIS — E669 Obesity, unspecified: Secondary | ICD-10-CM | POA: Diagnosis not present

## 2023-12-01 DIAGNOSIS — K449 Diaphragmatic hernia without obstruction or gangrene: Secondary | ICD-10-CM | POA: Diagnosis not present

## 2023-12-01 DIAGNOSIS — K219 Gastro-esophageal reflux disease without esophagitis: Secondary | ICD-10-CM | POA: Insufficient documentation

## 2023-12-01 HISTORY — PX: INSERTION OF MESH: SHX5868

## 2023-12-01 HISTORY — PX: XI ROBOTIC ASSISTED VENTRAL HERNIA: SHX6789

## 2023-12-01 SURGERY — REPAIR, HERNIA, VENTRAL, ROBOT-ASSISTED
Anesthesia: General | Site: Abdomen

## 2023-12-01 MED ORDER — DEXAMETHASONE SODIUM PHOSPHATE 10 MG/ML IJ SOLN
INTRAMUSCULAR | Status: DC | PRN
  Administered 2023-12-01: 10 mg via INTRAVENOUS

## 2023-12-01 MED ORDER — PROPOFOL 10 MG/ML IV BOLUS
INTRAVENOUS | Status: AC
Start: 2023-12-01 — End: 2023-12-01
  Filled 2023-12-01: qty 20

## 2023-12-01 MED ORDER — HYDROCODONE-ACETAMINOPHEN 5-325 MG PO TABS: 1.0000 | ORAL_TABLET | ORAL | 0 refills | Status: DC | PRN

## 2023-12-01 MED ORDER — VASOPRESSIN 20 UNIT/ML IV SOLN
INTRAVENOUS | Status: AC
Start: 2023-12-01 — End: 2023-12-01
  Filled 2023-12-01: qty 1

## 2023-12-01 MED ORDER — EPHEDRINE SULFATE-NACL 50-0.9 MG/10ML-% IV SOSY
PREFILLED_SYRINGE | INTRAVENOUS | Status: DC | PRN
  Administered 2023-12-01: 10 mg via INTRAVENOUS

## 2023-12-01 MED ORDER — VASOPRESSIN 20 UNIT/ML IV SOLN
INTRAVENOUS | Status: DC | PRN
  Administered 2023-12-01: 2 [IU] via INTRAVENOUS
  Administered 2023-12-01: 1 [IU] via INTRAVENOUS

## 2023-12-01 MED ORDER — DROPERIDOL 2.5 MG/ML IJ SOLN
INTRAMUSCULAR | Status: AC
  Filled 2023-12-01: qty 2

## 2023-12-01 MED ORDER — ACETAMINOPHEN 500 MG PO TABS
ORAL_TABLET | ORAL | Status: AC
  Filled 2023-12-01: qty 2

## 2023-12-01 MED ORDER — DEXAMETHASONE SODIUM PHOSPHATE 10 MG/ML IJ SOLN
INTRAMUSCULAR | Status: AC
  Filled 2023-12-01: qty 1

## 2023-12-01 MED ORDER — BUPIVACAINE-EPINEPHRINE (PF) 0.25% -1:200000 IJ SOLN
INTRAMUSCULAR | Status: AC
  Filled 2023-12-01: qty 30

## 2023-12-01 MED ORDER — ACETAMINOPHEN 10 MG/ML IV SOLN: 1000.0000 mg | Freq: Once | INTRAVENOUS | Status: DC | PRN

## 2023-12-01 MED ORDER — ONDANSETRON HCL 4 MG/2ML IJ SOLN
INTRAMUSCULAR | Status: DC | PRN
  Administered 2023-12-01: 4 mg via INTRAVENOUS

## 2023-12-01 MED ORDER — FENTANYL CITRATE (PF) 100 MCG/2ML IJ SOLN
INTRAMUSCULAR | Status: DC | PRN
  Administered 2023-12-01 (×2): 50 ug via INTRAVENOUS

## 2023-12-01 MED ORDER — ROCURONIUM BROMIDE 10 MG/ML (PF) SYRINGE
PREFILLED_SYRINGE | INTRAVENOUS | Status: AC
  Filled 2023-12-01: qty 10

## 2023-12-01 MED ORDER — CEFAZOLIN SODIUM-DEXTROSE 2-4 GM/100ML-% IV SOLN
INTRAVENOUS | Status: AC
Start: 2023-12-01 — End: 2023-12-01
  Filled 2023-12-01: qty 100

## 2023-12-01 MED ORDER — CELECOXIB 200 MG PO CAPS
ORAL_CAPSULE | ORAL | Status: AC
  Filled 2023-12-01: qty 1

## 2023-12-01 MED ORDER — ROCURONIUM BROMIDE 100 MG/10ML IV SOLN
INTRAVENOUS | Status: DC | PRN
  Administered 2023-12-01 (×2): 10 mg via INTRAVENOUS
  Administered 2023-12-01: 50 mg via INTRAVENOUS

## 2023-12-01 MED ORDER — PHENYLEPHRINE HCL-NACL 20-0.9 MG/250ML-% IV SOLN
INTRAVENOUS | Status: DC | PRN
  Administered 2023-12-01: 40 ug/min via INTRAVENOUS

## 2023-12-01 MED ORDER — LIDOCAINE HCL (PF) 2 % IJ SOLN
INTRAMUSCULAR | Status: AC
Start: 2023-12-01 — End: 2023-12-01
  Filled 2023-12-01: qty 5

## 2023-12-01 MED ORDER — ONDANSETRON HCL 4 MG/2ML IJ SOLN
INTRAMUSCULAR | Status: AC
Start: 2023-12-01 — End: 2023-12-01
  Filled 2023-12-01: qty 2

## 2023-12-01 MED ORDER — PROPOFOL 10 MG/ML IV BOLUS
INTRAVENOUS | Status: DC | PRN
  Administered 2023-12-01: 160 mg via INTRAVENOUS

## 2023-12-01 MED ORDER — MIDAZOLAM HCL 2 MG/2ML IJ SOLN
INTRAMUSCULAR | Status: AC
  Filled 2023-12-01: qty 2

## 2023-12-01 MED ORDER — BUPIVACAINE-EPINEPHRINE 0.25% -1:200000 IJ SOLN
INTRAMUSCULAR | Status: DC | PRN
  Administered 2023-12-01: 30 mL

## 2023-12-01 MED ORDER — PHENYLEPHRINE 80 MCG/ML (10ML) SYRINGE FOR IV PUSH (FOR BLOOD PRESSURE SUPPORT)
PREFILLED_SYRINGE | INTRAVENOUS | Status: AC
  Filled 2023-12-01: qty 10

## 2023-12-01 MED ORDER — CHLORHEXIDINE GLUCONATE 0.12 % MT SOLN
OROMUCOSAL | Status: AC
  Filled 2023-12-01: qty 15

## 2023-12-01 MED ORDER — DROPERIDOL 2.5 MG/ML IJ SOLN
0.6250 mg | Freq: Once | INTRAMUSCULAR | Status: AC | PRN
  Administered 2023-12-01: 0.625 mg via INTRAVENOUS

## 2023-12-01 MED ORDER — OXYCODONE HCL 5 MG PO TABS
ORAL_TABLET | ORAL | Status: AC
  Filled 2023-12-01: qty 1

## 2023-12-01 MED ORDER — LIDOCAINE HCL (CARDIAC) PF 100 MG/5ML IV SOSY
PREFILLED_SYRINGE | INTRAVENOUS | Status: DC | PRN
  Administered 2023-12-01: 50 mg via INTRAVENOUS

## 2023-12-01 MED ORDER — 0.9 % SODIUM CHLORIDE (POUR BTL) OPTIME
TOPICAL | Status: DC | PRN
  Administered 2023-12-01: 500 mL

## 2023-12-01 MED ORDER — GABAPENTIN 300 MG PO CAPS
ORAL_CAPSULE | ORAL | Status: AC
Start: 2023-12-01 — End: 2023-12-01
  Filled 2023-12-01: qty 1

## 2023-12-01 MED ORDER — PHENYLEPHRINE 80 MCG/ML (10ML) SYRINGE FOR IV PUSH (FOR BLOOD PRESSURE SUPPORT)
PREFILLED_SYRINGE | INTRAVENOUS | Status: DC | PRN
  Administered 2023-12-01 (×2): 80 ug via INTRAVENOUS
  Administered 2023-12-01 (×3): 240 ug via INTRAVENOUS
  Administered 2023-12-01: 80 ug via INTRAVENOUS

## 2023-12-01 MED ORDER — FENTANYL CITRATE (PF) 100 MCG/2ML IJ SOLN
INTRAMUSCULAR | Status: AC
  Filled 2023-12-01: qty 2

## 2023-12-01 MED ORDER — OXYCODONE HCL 5 MG PO TABS
5.0000 mg | ORAL_TABLET | Freq: Once | ORAL | Status: AC | PRN
  Administered 2023-12-01: 5 mg via ORAL

## 2023-12-01 MED ORDER — PHENYLEPHRINE HCL-NACL 20-0.9 MG/250ML-% IV SOLN
INTRAVENOUS | Status: AC
Start: 2023-12-01 — End: 2023-12-01
  Filled 2023-12-01: qty 250

## 2023-12-01 MED ORDER — PROPOFOL 10 MG/ML IV BOLUS
INTRAVENOUS | Status: AC
  Filled 2023-12-01: qty 20

## 2023-12-01 MED ORDER — MIDAZOLAM HCL 2 MG/2ML IJ SOLN
INTRAMUSCULAR | Status: DC | PRN
  Administered 2023-12-01: 2 mg via INTRAVENOUS

## 2023-12-01 MED ORDER — FENTANYL CITRATE (PF) 100 MCG/2ML IJ SOLN
25.0000 ug | INTRAMUSCULAR | Status: DC | PRN
  Administered 2023-12-01 (×3): 25 ug via INTRAVENOUS

## 2023-12-01 MED ORDER — OXYCODONE HCL 5 MG/5ML PO SOLN: 5.0000 mg | Freq: Once | ORAL | Status: AC | PRN

## 2023-12-01 MED ORDER — SUGAMMADEX SODIUM 200 MG/2ML IV SOLN
INTRAVENOUS | Status: DC | PRN
  Administered 2023-12-01: 200 mg via INTRAVENOUS

## 2023-12-01 MED ORDER — LACTATED RINGERS IV SOLN: INTRAVENOUS | Status: DC | PRN

## 2023-12-01 SURGICAL SUPPLY — 40 items
BINDER ABD UNIV 12 45-62 (WOUND CARE) IMPLANT
COVER LIGHT HANDLE STERIS (MISCELLANEOUS) IMPLANT
COVER TIP SHEARS 8 DVNC (MISCELLANEOUS) ×2 IMPLANT
COVER WAND RF STERILE (DRAPES) ×2 IMPLANT
DEFOGGER SCOPE WARM SEASHARP (MISCELLANEOUS) ×2 IMPLANT
DERMABOND ADVANCED .7 DNX12 (GAUZE/BANDAGES/DRESSINGS) ×2 IMPLANT
DRAPE ARM DVNC X/XI (DISPOSABLE) ×6 IMPLANT
DRAPE COLUMN DVNC XI (DISPOSABLE) ×2 IMPLANT
ELECTRODE REM PT RTRN 9FT ADLT (ELECTROSURGICAL) ×2 IMPLANT
FORCEPS BPLR R/ABLATION 8 DVNC (INSTRUMENTS) ×2 IMPLANT
GLOVE BIO SURGEON STRL SZ7 (GLOVE) ×4 IMPLANT
GOWN STRL REUS W/ TWL LRG LVL3 (GOWN DISPOSABLE) ×6 IMPLANT
GRASPER SUT TROCAR 14GX15 (MISCELLANEOUS) ×2 IMPLANT
IRRIGATION STRYKERFLOW (MISCELLANEOUS) IMPLANT
IV 0.9% NACL 1000 ML (IV SOLUTION) IMPLANT
KIT PINK PAD W/HEAD ARM REST (MISCELLANEOUS) ×2 IMPLANT
LABEL OR SOLS (LABEL) ×2 IMPLANT
MANIFOLD NEPTUNE II (INSTRUMENTS) ×2 IMPLANT
MESH VENTRALIGHT ST 4.5IN (Mesh General) IMPLANT
NDL DRIVE SUT CUT DVNC (INSTRUMENTS) ×2 IMPLANT
NDL HYPO 22X1.5 SAFETY MO (MISCELLANEOUS) ×2 IMPLANT
NDL INSUFFLATION 14GA 120MM (NEEDLE) ×2 IMPLANT
NEEDLE DRIVE SUT CUT DVNC (INSTRUMENTS) ×2 IMPLANT
NEEDLE HYPO 22X1.5 SAFETY MO (MISCELLANEOUS) ×2 IMPLANT
NEEDLE INSUFFLATION 14GA 120MM (NEEDLE) ×2 IMPLANT
NS IRRIG 500ML POUR BTL (IV SOLUTION) IMPLANT
OBTURATOR OPTICALSTD 8 DVNC (TROCAR) ×2 IMPLANT
PACK LAP CHOLECYSTECTOMY (MISCELLANEOUS) ×2 IMPLANT
SCISSORS MNPLR CVD DVNC XI (INSTRUMENTS) ×2 IMPLANT
SEAL UNIV 5-12 XI (MISCELLANEOUS) ×6 IMPLANT
SET TUBE SMOKE EVAC HIGH FLOW (TUBING) ×2 IMPLANT
SOLUTION ELECTROSURG ANTI STCK (MISCELLANEOUS) ×2 IMPLANT
SPONGE T-LAP 18X18 ~~LOC~~+RFID (SPONGE) ×2 IMPLANT
SUT STRATA 2-0 30 CT-2 (SUTURE) ×2 IMPLANT
SUT STRATAFIX PDS 30 CT-1 (SUTURE) ×2 IMPLANT
SUT VICRYL 0 UR6 27IN ABS (SUTURE) IMPLANT
SUTURE MNCRL 4-0 27XMF (SUTURE) ×2 IMPLANT
SYR 20ML LL LF (SYRINGE) ×2 IMPLANT
TAPE TRANSPORE STRL 2 31045 (GAUZE/BANDAGES/DRESSINGS) ×2 IMPLANT
TRAP FLUID SMOKE EVACUATOR (MISCELLANEOUS) ×2 IMPLANT

## 2023-12-01 NOTE — Transfer of Care (Signed)
 Immediate Anesthesia Transfer of Care Note  Patient: Amanda Ashley  Procedure(s) Performed: REPAIR, HERNIA, VENTRAL, ROBOT-ASSISTED (Abdomen) INSERTION OF MESH (Abdomen)  Patient Location: PACU  Anesthesia Type:General  Level of Consciousness: drowsy and patient cooperative  Airway & Oxygen Therapy: Patient Spontanous Breathing and Patient connected to face mask oxygen  Post-op Assessment: Report given to RN and Post -op Vital signs reviewed and stable  Post vital signs: Reviewed and stable  Last Vitals:  Vitals Value Taken Time  BP 141/92 12/01/23 12:15  Temp 36.1 C 12/01/23 12:15  Pulse 95 12/01/23 12:15  Resp 14 12/01/23 12:15  SpO2 93 % 12/01/23 12:15    Last Pain:  Vitals:   12/01/23 1215  TempSrc: Tympanic  PainSc: 3          Complications: No notable events documented.

## 2023-12-01 NOTE — Op Note (Signed)
 Robotic assisted laparoscopic ventral hernia Repair IPOM using 11.4cm ventralight BARD mesh   Pre-operative Diagnosis:  ventral hernia   Post-operative Diagnosis: same   Surgeon: Laneta Luna, MD FACS   Anesthesia: Gen. with endotracheal tube     Findings: 3 cm reducible ventral hernia   Estimated Blood Loss: 5cc         Complications: none             Procedure Details  The patient was seen again in the Holding Room. The benefits, complications, treatment options, and expected outcomes were discussed with the patient. The risks of bleeding, infection, recurrence of symptoms, failure to resolve symptoms, bowel injury, mesh placement, mesh infection, any of which could require further surgery were reviewed with the patient. The likelihood of improving the patient's symptoms with return to their baseline status is good.  The patient and/or family concurred with the proposed plan, giving informed consent.  The patient was taken to Operating Room, identified and the procedure verified.  A Time Out was held and the above information confirmed.   Prior to the induction of general anesthesia, antibiotic prophylaxis was administered. VTE prophylaxis was in place. General endotracheal anesthesia was then administered and tolerated well. After the induction, the abdomen was prepped with Chloraprep and draped in the sterile fashion. The patient was positioned in the supine position.   We used a left upper quadrant incision and Veres needle was inserted at Palmer's point, saline test was appropriate as well as pressures,  pneumoperitoneum obtained.  She did have low BP that responded to fluid and neo. A total of 3 8 mm ports were placed under direct visualization.  I visualized the hernia and there was a  ventral hernia measuring 3 cm..  At this time I went ahead and inserted the 11.4 cms ventralight mesh.   The robot was brought to the surgical field and docked in the standard fashion.  We made sure  that all instrumentation was kept under direct vision at all times and there was no collision between the arms.  I scrubbed out and went to the console. Falciform was taken down with electrocautery to allow adequate mesh placement.  I Confirmed and measured the defect again.    Using a 0 stratafix suture we closed the ventral defect primarily incorporating the sac .  Using the stratafix in a chandelier fashion mesh was brought towards the abdominal wall.   The mesh was secured circumferentially to the abdominal wall using 2 stratafix in  a double crown standard fashion.   The mesh layed really nicely against the  abdominal wall. A second look laparoscopy revealed no evidence of intra-abdominal injury.    All the needles and foreign objects were removed under direct visualization.  The instruments were removed and the robot was undocked.  I scrubbed back in, The laparoscopic ports were removed under direct visualization and the pneumoperitoneum was deflated.   Incisions were closed with  4-0 Monocryl  And the fascial sutures approximated in the standard fashion Dermabond was used to coat the skin. Liposomal Marcaine   was used to inject all the incision sites. Patient tolerated procedure well and there were no immediate complications. Needle and laparotomy counts were correct

## 2023-12-01 NOTE — Interval H&P Note (Signed)
 History and Physical Interval Note:  12/01/2023 9:31 AM  Amanda Ashley  has presented today for surgery, with the diagnosis of ventral hernia initial reducible 3 cm.  The various methods of treatment have been discussed with the patient and family. After consideration of risks, benefits and other options for treatment, the patient has consented to  Procedure(s): REPAIR, HERNIA, VENTRAL, ROBOT-ASSISTED (N/A) as a surgical intervention.  The patient's history has been reviewed, patient examined, no change in status, stable for surgery.  I have reviewed the patient's chart and labs.  Questions were answered to the patient's satisfaction.     Ayanna Gheen F Jashawna Reever

## 2023-12-01 NOTE — Anesthesia Preprocedure Evaluation (Addendum)
 Anesthesia Evaluation  Patient identified by MRN, date of birth, ID band Patient awake    Reviewed: Allergy & Precautions, H&P , NPO status , Patient's Chart, lab work & pertinent test results  Airway Mallampati: II  TM Distance: >3 FB Neck ROM: full    Dental no notable dental hx.    Pulmonary neg pulmonary ROS   Pulmonary exam normal        Cardiovascular negative cardio ROS Normal cardiovascular exam     Neuro/Psych negative neurological ROS  negative psych ROS   GI/Hepatic Neg liver ROS, hiatal hernia,GERD  ,,Large hiatal hernia   Endo/Other  diabetes, Type 2    Renal/GU      Musculoskeletal   Abdominal  (+) + obese  Peds  Hematology negative hematology ROS (+)   Anesthesia Other Findings Past Medical History: No date: Acid reflux No date: Arrhythmia No date: Calculus of gallbladder and bile duct with acute  cholecystitis without obstruction No date: DM (diabetes mellitus), type 2 (HCC)     Comment:  no meds No date: Elevated alkaline phosphatase level No date: Family history of adverse reaction to anesthesia     Comment:  mom-bp issues No date: Heart murmur No date: Paraesophageal hiatal hernia     Comment:  large No date: Ventral hernia  Past Surgical History: 02/03/2022: CHOLECYSTECTOMY; N/A     Comment:  Procedure: LAPAROSCOPIC CHOLECYSTECTOMY;  Surgeon:               Jordis Laneta FALCON, MD;  Location: [facility] ORS;  Service:               General;  Laterality: N/A; No date: OOPHORECTOMY; Right No date: TUBAL LIGATION     Reproductive/Obstetrics negative OB ROS                              Anesthesia Physical Anesthesia Plan  ASA: 2  Anesthesia Plan: General ETT   Post-op Pain Management: Toradol  IV (intra-op)* and Ofirmev  IV (intra-op)*   Induction: Intravenous  PONV Risk Score and Plan: 2 and Ondansetron , Dexamethasone  and Propofol  infusion  Airway Management  Planned: Oral ETT  Additional Equipment:   Intra-op Plan:   Post-operative Plan: Extubation in OR  Informed Consent: I have reviewed the patients History and Physical, chart, labs and discussed the procedure including the risks, benefits and alternatives for the proposed anesthesia with the patient or authorized representative who has indicated his/her understanding and acceptance.     Dental Advisory Given  Plan Discussed with: CRNA and Surgeon  Anesthesia Plan Comments:          Anesthesia Quick Evaluation

## 2023-12-01 NOTE — Anesthesia Procedure Notes (Signed)
 Procedure Name: Intubation Date/Time: 12/01/2023 10:44 AM  Performed by: Lorrene Camelia LABOR, CRNAPre-anesthesia Checklist: Patient identified, Patient being monitored, Emergency Drugs available and Suction available Patient Re-evaluated:Patient Re-evaluated prior to induction Oxygen Delivery Method: Circle system utilized Preoxygenation: Pre-oxygenation with 100% oxygen Induction Type: IV induction Ventilation: Mask ventilation without difficulty Laryngoscope Size: 3 and McGrath Grade View: Grade I Tube type: Oral Tube size: 7.0 mm Number of attempts: 1 Airway Equipment and Method: Stylet Placement Confirmation: ETT inserted through vocal cords under direct vision, positive ETCO2 and breath sounds checked- equal and bilateral Secured at: 22 cm Tube secured with: Tape Dental Injury: Teeth and Oropharynx as per pre-operative assessment

## 2023-12-02 ENCOUNTER — Encounter: Payer: Self-pay | Admitting: Surgery

## 2023-12-02 NOTE — Anesthesia Postprocedure Evaluation (Signed)
 Anesthesia Post Note  Patient: Amanda Ashley  Procedure(s) Performed: REPAIR, HERNIA, VENTRAL, ROBOT-ASSISTED (Abdomen) INSERTION OF MESH (Abdomen)  Patient location during evaluation: PACU Anesthesia Type: General Level of consciousness: awake and alert Pain management: pain level controlled Vital Signs Assessment: post-procedure vital signs reviewed and stable Respiratory status: spontaneous breathing, nonlabored ventilation and respiratory function stable Cardiovascular status: blood pressure returned to baseline and stable Postop Assessment: no apparent nausea or vomiting Anesthetic complications: no   No notable events documented.   Last Vitals:  Vitals:   12/01/23 1440 12/01/23 1522  BP: 128/81 125/69  Pulse: (!) 107 95  Resp: 20 20  Temp: 36.8 C 37 C  SpO2: 98% 97%    Last Pain:  Vitals:   12/01/23 1522  TempSrc: Temporal  PainSc:                  Camellia Merilee Louder

## 2023-12-09 ENCOUNTER — Ambulatory Visit
Admission: EM | Admit: 2023-12-09 | Discharge: 2023-12-09 | Disposition: A | Discharge status: Home / Self Care | Attending: Emergency Medicine | Admitting: Emergency Medicine

## 2023-12-09 DIAGNOSIS — R21 Rash and other nonspecific skin eruption: Secondary | ICD-10-CM

## 2023-12-09 MED ORDER — CETIRIZINE HCL 10 MG PO TABS: 10.0000 mg | ORAL_TABLET | Freq: Every day | ORAL | 0 refills | Status: AC

## 2023-12-09 NOTE — ED Provider Notes (Signed)
 CAY RALPH PELT    CSN: [id number] Arrival date & time: 12/09/23  1146      History   Chief Complaint Chief Complaint  Patient presents with   Hand Problem    HPI Amanda Ashley is a 53 y.o. female.  Accompanied by her daughter, patient presents with 1 day history of pruritic rash below her bottom lip, on the back of her neck, on her right hand, on her left wrist.  Her right hand is swollen.  No injury to her right hand.  Patient had hernia repair surgery on 12/01/2023.  She was prescribed hydrocodone postoperatively but has not taken this medication since 12/02/2023.  No other new medications.  No new products or foods.  No treatments or medications taken for her current rash.  No difficulty swallowing or breathing.  No fever, chills, sore throat, cough, shortness of breath.  She contacted her surgeons office and was instructed to come here.  The history is provided by the patient, medical records and a relative.    Past Medical History:  Diagnosis Date   Acid reflux    Arrhythmia    Calculus of gallbladder and bile duct with acute cholecystitis without obstruction    DM (diabetes mellitus), type 2 (HCC)    no meds   Elevated alkaline phosphatase level    Family history of adverse reaction to anesthesia    mom-bp issues   Heart murmur    Paraesophageal hiatal hernia    large   Ventral hernia     Patient Active Problem List   Diagnosis Date Noted   Incarcerated ventral hernia 12/01/2023   Calculus of gallbladder with acute cholecystitis without obstruction 02/02/2022    Past Surgical History:  Procedure Laterality Date   CHOLECYSTECTOMY N/A 02/03/2022   Procedure: LAPAROSCOPIC CHOLECYSTECTOMY;  Surgeon: Jordis Laneta FALCON, MD;  Location: [facility] ORS;  Service: General;  Laterality: N/A;   INSERTION OF MESH  12/01/2023   Procedure: INSERTION OF MESH;  Surgeon: Jordis Laneta FALCON, MD;  Location: [facility] ORS;  Service: General;;   OOPHORECTOMY Right    TUBAL LIGATION      XI ROBOTIC ASSISTED VENTRAL HERNIA N/A 12/01/2023   Procedure: REPAIR, HERNIA, VENTRAL, ROBOT-ASSISTED;  Surgeon: Jordis Laneta FALCON, MD;  Location: [facility] ORS;  Service: General;  Laterality: N/A;    OB History   No obstetric history on file.      Home Medications    Prior to Admission medications   Medication Sig Start Date End Date Taking? Authorizing Provider  cetirizine (ZYRTEC ALLERGY) 10 MG tablet Take 1 tablet (10 mg total) by mouth daily for 14 days. 12/09/23 12/23/23 Yes Corlis Burnard DEL, NP  predniSONE (DELTASONE) 10 MG tablet Take 4 tablets (40 mg total) by mouth daily for 3 days. 12/09/23 12/12/23 Yes Corlis Burnard DEL, NP  HYDROcodone-acetaminophen  (NORCO/VICODIN) 5-325 MG tablet Take 1-2 tablets by mouth every 4 (four) hours as needed for moderate pain (pain score 4-6). 12/01/23   Jordis Laneta FALCON, MD    Family History Family History  Problem Relation Age of Onset   Breast cancer Maternal Aunt     Social History Social History   Tobacco Use   Smoking status: Never    Passive exposure: Never   Smokeless tobacco: Never  Vaping Use   Vaping status: Never Used  Substance Use Topics   Alcohol use: No   Drug use: No     Allergies   Patient has no known allergies.   Review  of Systems Review of Systems  Constitutional:  Negative for chills and fever.  HENT:  Negative for sore throat, trouble swallowing and voice change.   Respiratory:  Negative for cough, shortness of breath and wheezing.   Musculoskeletal:  Positive for joint swelling. Negative for arthralgias.  Skin:  Positive for color change and rash.  Neurological:  Negative for weakness and numbness.     Physical Exam Triage Vital Signs ED Triage Vitals  Encounter Vitals Group     BP      Girls Systolic BP Percentile      Girls Diastolic BP Percentile      Boys Systolic BP Percentile      Boys Diastolic BP Percentile      Pulse      Resp      Temp      Temp src      SpO2      Weight      Height       Head Circumference      Peak Flow      Pain Score      Pain Loc      Pain Education      Exclude from Growth Chart    No data found.  Updated Vital Signs BP 129/89   Pulse 88   Temp 98.6 F (37 C)   Resp 19   LMP 07/30/2020   SpO2 98%   Visual Acuity Right Eye Distance:   Left Eye Distance:   Bilateral Distance:    Right Eye Near:   Left Eye Near:    Bilateral Near:     Physical Exam Constitutional:      General: She is not in acute distress. HENT:     Mouth/Throat:     Mouth: Mucous membranes are moist.     Pharynx: Oropharynx is clear.  Cardiovascular:     Rate and Rhythm: Normal rate and regular rhythm.     Heart sounds: Normal heart sounds.  Pulmonary:     Effort: Pulmonary effort is normal. No respiratory distress.     Breath sounds: Normal breath sounds.  Skin:    General: Skin is warm and dry.     Findings: Rash present.     Comments: Mildly erythematous patchy and papular rash on posterior neck, right hand, left wrist.  No rash noted on face at this time.  Mild edema to right hand.  No wounds.  Neurological:     Mental Status: She is alert.      UC Treatments / Results  Labs (all labs ordered are listed, but only abnormal results are displayed) Labs Reviewed - No data to display  EKG   Radiology No results found.  Procedures Procedures (including critical care time)  Medications Ordered in UC Medications - No data to display  Initial Impression / Assessment and Plan / UC Course  I have reviewed the triage vital signs and the nursing notes.  Pertinent labs & imaging results that were available during my care of the patient were reviewed by me and considered in my medical decision making (see chart for details).    Rash.  Afebrile and vital signs are stable.  Patient had hernia surgery on 12/01/2023.  She was prescribed hydrocodone at that time but has not taken any since the day after her surgery.  No other new medications.  No new  products or foods.  She does not have any difficulty swallowing or breathing.  Treating today with  short 3-day course of prednisone as I do not want to impair wound healing from her surgery.  Also treating with Zyrtec.  Instructed her to follow-up with her PCP on Monday.  ED precautions given.  Education provided on rash.  She agrees to plan of care.  Final Clinical Impressions(s) / UC Diagnoses   Final diagnoses:  Rash     Discharge Instructions      Take the prednisone and Zyrtec as directed.  Follow up with your primary care provider.  Go to the emergency department if you have worsening symptoms.        ED Prescriptions     Medication Sig Dispense Auth. Provider   predniSONE (DELTASONE) 10 MG tablet Take 4 tablets (40 mg total) by mouth daily for 3 days. 12 tablet Corlis Burnard DEL, NP   cetirizine (ZYRTEC ALLERGY) 10 MG tablet Take 1 tablet (10 mg total) by mouth daily for 14 days. 14 tablet Corlis Burnard DEL, NP      PDMP not reviewed this encounter.   Corlis Burnard DEL, NP 12/09/23 1226

## 2023-12-09 NOTE — Discharge Instructions (Addendum)
 Take the prednisone and Zyrtec as directed.  Follow up with your primary care provider.  Go to the emergency department if you have worsening symptoms.

## 2023-12-09 NOTE — ED Triage Notes (Signed)
 Patient to Urgent Care with complaints of right sided hand swelling and itching. Same to bottom lip/ neck/ back of head/ around waist/ face.   Symptoms started yesterday. Surgery last Thursday.   No otc meds.

## 2023-12-14 ENCOUNTER — Ambulatory Visit (INDEPENDENT_AMBULATORY_CARE_PROVIDER_SITE_OTHER): Admitting: Surgery

## 2023-12-14 ENCOUNTER — Encounter: Payer: Self-pay | Admitting: Surgery

## 2023-12-14 VITALS — BP 121/82 | HR 83 | Temp 98.2°F | Ht 65.0 in | Wt 226.0 lb

## 2023-12-14 DIAGNOSIS — K439 Ventral hernia without obstruction or gangrene: Secondary | ICD-10-CM

## 2023-12-14 DIAGNOSIS — Z09 Encounter for follow-up examination after completed treatment for conditions other than malignant neoplasm: Secondary | ICD-10-CM | POA: Diagnosis not present

## 2023-12-14 NOTE — Patient Instructions (Signed)

## 2023-12-15 NOTE — Progress Notes (Signed)
 Outpatient Surgical Follow Up    Amanda Ashley is an 53 y.o. female.   Chief Complaint  Patient presents with   Routine Post Op    HPI: Amanda Ashley is a very pleasant 53 year old status post robotic hernia repair couple weeks ago.  She developed a rash shortly after taking Lortab.  This subsided.  Went to urgent care and given a prescription.  She did not require pain medicines and was able to deal with the pain with Tylenol .  She is having no pain.  No fevers no chills she is tolerating diet and ambulating  Past Medical History:  Diagnosis Date   Acid reflux    Arrhythmia    Calculus of gallbladder and bile duct with acute cholecystitis without obstruction    DM (diabetes mellitus), type 2 (HCC)    no meds   Elevated alkaline phosphatase level    Family history of adverse reaction to anesthesia    mom-bp issues   Heart murmur    Paraesophageal hiatal hernia    large   Ventral hernia     Past Surgical History:  Procedure Laterality Date   CHOLECYSTECTOMY N/A 02/03/2022   Procedure: LAPAROSCOPIC CHOLECYSTECTOMY;  Surgeon: Jordis Laneta FALCON, MD;  Location: [facility];  Service: General;  Laterality: N/A;   INSERTION OF MESH  12/01/2023   Procedure: INSERTION OF MESH;  Surgeon: Jordis Laneta FALCON, MD;  Location: [facility] OR[facility];  Service: General;;   OOPHORECTOMY Right    TUBAL LIGATION     XI ROBOTIC ASSISTED VENTRAL HERNIA N/A 12/01/2023   Procedure: REPAIR, HERNIA, VENTRAL, ROBOT-ASSISTED;  Surgeon: Jordis Laneta FALCON, MD;  Location: [facility] OR[facility];  Service: General;  Laterality: N/A;    Family History  Problem Relation Age of Onset   Breast cancer Maternal Aunt     Social History:  reports that she has never smoked. She has never been exposed to tobacco smoke. She has never used smokeless tobacco. She reports that she does not drink alcohol and does not use drugs.  Allergies: No Known Allergies  Medications reviewed.    ROS Full ROS performed and is otherwise negative other than what is  stated in HPI   BP 121/82   Pulse 83   Temp 98.2 F (36.8 C) (Oral)   Ht 5' 5 (1.651 m)   Wt 226 lb (102.5 kg)   LMP 07/30/2020   SpO2 97%   BMI 37.61 kg/m   Physical Exam  She is in no acute distress awake and alert.  Abdomen: Soft nontender no rash incisions healing well without infection.  No recurrence   assessment/Plan: 53 year old status post robotic ventral hernia repair doing very well.  She did develop a temporary allergic reaction likely from the Norco.  Fortunately her reaction has subsided.  She is having no issues or complications related to  recent surgery. She does have significant reflux and a paraesophageal hernia.  I would like to see her in a few months and revisit that issue.  Discussed with her that I would not be in a rush to fix the paraesophageal hernia that I would like her to recover from this surgery first. I personally spent a total of 20 minutes in the care of the patient today including performing a medically appropriate exam/evaluation, counseling and educating, placing orders, referring and communicating with other health care professionals, documenting clinical information in the EHR, independently interpreting and reviewing images studies and coordinating care.   Laneta Jordis, MD [facility] General Surgeon

## 2024-01-04 ENCOUNTER — Encounter: Payer: Self-pay | Admitting: Surgery

## 2024-01-04 ENCOUNTER — Ambulatory Visit (INDEPENDENT_AMBULATORY_CARE_PROVIDER_SITE_OTHER): Admitting: Surgery

## 2024-01-04 VITALS — BP 124/78 | HR 86 | Ht 65.0 in | Wt 225.0 lb

## 2024-01-04 DIAGNOSIS — K449 Diaphragmatic hernia without obstruction or gangrene: Secondary | ICD-10-CM | POA: Diagnosis not present

## 2024-01-04 NOTE — Progress Notes (Signed)
 Outpatient Surgical Follow Up  01/04/2024  Amanda Ashley is an 53 y.o. female.   Chief Complaint  Patient presents with   Routine Post Op    HPI: s/p rob Ventral hernia 3 1/2 weeks ago. Doing very well, no fevers or chills, No abd pain, she is very appreciative. She continue to have sxs related to paraesophageal hernia type III, I have  pers reviewed CT showing giant paraeophageal H type III w 2/3 stomach within mediastinum. She has never had a colonoscopy  Past Medical History:  Diagnosis Date   Acid reflux    Arrhythmia    Calculus of gallbladder and bile duct with acute cholecystitis without obstruction    DM (diabetes mellitus), type 2 (HCC)    no meds   Elevated alkaline phosphatase level    Family history of adverse reaction to anesthesia    mom-bp issues   Heart murmur    Paraesophageal hiatal hernia    large   Ventral hernia     Past Surgical History:  Procedure Laterality Date   CHOLECYSTECTOMY N/A 02/03/2022   Procedure: LAPAROSCOPIC CHOLECYSTECTOMY;  Surgeon: Jordis Laneta FALCON, MD;  Location: [facility] OR[facility];  Service: General;  Laterality: N/A;   INSERTION OF MESH  12/01/2023   Procedure: INSERTION OF MESH;  Surgeon: Jordis Laneta FALCON, MD;  Location: [facility] OR[facility];  Service: General;;   OOPHORECTOMY Right    TUBAL LIGATION     XI ROBOTIC ASSISTED VENTRAL HERNIA N/A 12/01/2023   Procedure: REPAIR, HERNIA, VENTRAL, ROBOT-ASSISTED;  Surgeon: Jordis Laneta FALCON, MD;  Location: [facility] OR[facility];  Service: General;  Laterality: N/A;    Family History  Problem Relation Age of Onset   Breast cancer Maternal Aunt     Social History:  reports that she has never smoked. She has never been exposed to tobacco smoke. She has never used smokeless tobacco. She reports that she does not drink alcohol and does not use drugs.  Allergies: No Known Allergies  Medications reviewed.    ROS Full ROS performed and is otherwise negative other than what is stated in HPI   BP 124/78   Pulse 86    Ht 5' 5 (1.651 m)   Wt 225 lb (102.1 kg)   LMP 07/30/2020   SpO2 97%   BMI 37.44 kg/m   Physical Exam Vitals and nursing note reviewed. Exam conducted with a chaperone present.  Constitutional:      General: She is not in acute distress.    Appearance: She is not ill-appearing.  Cardiovascular:     Rate and Rhythm: Normal rate and regular rhythm.  Pulmonary:     Effort: Pulmonary effort is normal. No respiratory distress.     Breath sounds: Normal breath sounds. No stridor. No wheezing or rhonchi.  Abdominal:     General: Abdomen is flat. There is no distension.     Palpations: Abdomen is soft. There is no mass.     Tenderness: There is no abdominal tenderness. There is no guarding or rebound.     Hernia: No hernia is present.     Comments: Incisions healing well w/o infection , no rebound, no recurrence   Skin:    General: Skin is warm and dry.     Capillary Refill: Capillary refill takes less than 2 seconds.  Neurological:     General: No focal deficit present.     Mental Status: She is alert and oriented to person, place, and time.  Psychiatric:  Mood and Affect: Mood normal.        Behavior: Behavior normal.        Thought Content: Thought content normal.        Judgment: Judgment normal.       Assessment/Plan: S/p ventral hernia repair w/o issues or complications. Paraesophageal type II, symptomatic, She will need further w/u including EGD, colonoscopy and barium swallow.  RTC 6 months to re-evaluate paraesophageal H . Encourage diet and exercise I personally spent a total of 30 minutes in the care of the patient today including performing a medically appropriate exam/evaluation, counseling and educating, placing orders, referring and communicating with other health care professionals, documenting clinical information in the EHR, independently interpreting and reviewing images studies and coordinating care.   Laneta Luna, MD [facility] General Surgeon

## 2024-01-04 NOTE — Patient Instructions (Addendum)
 We will send a referral to [facility] for them to schedule you for a Colonoscopy and Upper Endoscopy with Dr Marinda. They will call you to set this up.  Enviaremos una solicitud a [facility] para que le programen una colonoscopia y ignacia endoscopia digestiva alta con el Dr. Marinda. Ellos se comunicarn con usted para coordinar la cita.  We will also schedule you for a swallow study to look at your swallowing function.  Tambin le programaremos un estudio de deglucin para evaluar su funcin de deglucin.  We will see you back in 6 months for your Paraesophageal Hernia. We will send you a letter about this appointment.  Le citaremos de nuevo en 6 meses para su hernia paraesofgica. Le enviaremos una carta confirmando la cita.  You are scheduled for a Swallow Study at [facility] on 01/10/24. You will need to arrive there by 8:30 am and enter in through the [facility] entrance and check in at the Registration desk. You will need to have nothing to eat for 2 hours prior. Tiene programada una prueba de deglucin en [facility] el 18/12/2023. Deber llegar a las 8:30 a. m., entrar por la entrada del [facility] y english as a second language teacher en la recepcin. No debe comer nada durante las dos horas previas.  INSTRUCCIONES GENERALES POSTOPERATORIAS INSTRUCCIONES PARA EL PACIENTE  INSTRUCCIONES PARA EL CUIDADO DE LA HERIDA: Mantenga la herida seca y evite aplicar ungentos a menos que se lo indiquen. Si la herida se enrojece y duele, o comienza a merchant navy officer infectado que no es transparente, comunquese con su mdico de inmediato. Si la herida est ligeramente rosada y presenta una cresta gruesa y [place], es normal y se denomina cresta de cicatrizacin. Esta desaparecer en las prximas 4 a 6 semanas.  BAO: Puede ducharse si su cirujano se lo ha indicado. Sin embargo, no se sumerja en una baera, jacuzzi ni piscina hasta que las incisiones estn completamente selladas o su  cirujano se lo haya indicado.  ACTIVIDAD: Se recomienda caminar y realizar actividades ligeras durante las [employer]. No debe levantar ms de 9 kg durante las 6 semanas posteriores a la [place], ya que esto podra aumentar el riesgo de complicaciones. 9 kg equivalen aproximadamente a una bolsa de plstico del supermercado. En ese momento, escuche a su cuerpo al levantar objetos. Si siente dolor al sonny, detngase y vuelva a intentarlo en [address] El dolor despus de hacer ejercicio o actividades cotidianas es normal a medida que regresa a su rutina habitual.  MEDICAMENTOS: Intente tomar medicamentos narcticos y antiinflamatorios, como Tylenol , ibuprofeno, Naprosyn, etc., con las comidas. Esto minimizar environmental health practitioner estomacal causado por el medicamento. Si presenta nuseas y vmitos a causa del analgsico, o si presenta sarpullido, suspenda el medicamento y consulte a su mdico. No debe conducir, tomar decisiones importantes ni operar maquinaria mientras est tomando analgsicos narcticos.  BLOQUEADOR SOLAR Use protector solar en la zona de la incisin durante el prximo ao si estar expuesta al sol. Esto ayuda a disminuir las cicatrices y occupational hygienist que la zona se oscurezca permanentemente sobre la incisin.  PREGUNTAS: Si tiene alguna pregunta, no dude en llamar a nuestro consultorio. Con gusto le ayudaremos. [phone]

## 2024-01-10 ENCOUNTER — Ambulatory Visit
Admission: RE | Admit: 2024-01-10 | Discharge: 2024-01-10 | Disposition: A | Discharge status: Home / Self Care | Source: Ambulatory Visit | Attending: Surgery | Admitting: Surgery

## 2024-01-10 DIAGNOSIS — K449 Diaphragmatic hernia without obstruction or gangrene: Secondary | ICD-10-CM | POA: Diagnosis present

## 2024-02-21 ENCOUNTER — Other Ambulatory Visit: Payer: Self-pay

## 2024-02-21 DIAGNOSIS — K449 Diaphragmatic hernia without obstruction or gangrene: Secondary | ICD-10-CM

## 2024-02-21 DIAGNOSIS — Z1211 Encounter for screening for malignant neoplasm of colon: Secondary | ICD-10-CM

## 2024-02-21 MED ORDER — NA SULFATE-K SULFATE-MG SULF 17.5-3.13-1.6 GM/177ML PO SOLN: 354.0000 mL | Freq: Once | ORAL | 0 refills | Status: AC

## 2024-02-21 NOTE — Addendum Note (Signed)
 Addended by: JODIE HEADINGS on: 02/21/2024 11:26 AM   Modules accepted: Orders

## 2024-02-29 ENCOUNTER — Ambulatory Visit: Admitting: Anesthesiology

## 2024-02-29 ENCOUNTER — Ambulatory Visit
Admission: RE | Admit: 2024-02-29 | Discharge: 2024-02-29 | Disposition: A | Discharge status: Home / Self Care | Attending: General Surgery | Admitting: General Surgery

## 2024-02-29 ENCOUNTER — Encounter: Payer: Self-pay | Admitting: General Surgery

## 2024-02-29 ENCOUNTER — Encounter: Admission: RE | Disposition: A | Payer: Self-pay | Source: Home / Self Care | Attending: General Surgery

## 2024-02-29 DIAGNOSIS — K219 Gastro-esophageal reflux disease without esophagitis: Secondary | ICD-10-CM | POA: Insufficient documentation

## 2024-02-29 DIAGNOSIS — Z1211 Encounter for screening for malignant neoplasm of colon: Secondary | ICD-10-CM | POA: Diagnosis present

## 2024-02-29 DIAGNOSIS — K449 Diaphragmatic hernia without obstruction or gangrene: Secondary | ICD-10-CM | POA: Insufficient documentation

## 2024-02-29 DIAGNOSIS — E119 Type 2 diabetes mellitus without complications: Secondary | ICD-10-CM | POA: Insufficient documentation

## 2024-02-29 HISTORY — PX: COLONOSCOPY: SHX5424

## 2024-02-29 HISTORY — PX: ESOPHAGOGASTRODUODENOSCOPY: SHX5428

## 2024-02-29 LAB — GLUCOSE, CAPILLARY: Glucose-Capillary: 132 mg/dL — ABNORMAL HIGH (ref 70–99)

## 2024-02-29 MED ORDER — SODIUM CHLORIDE 0.9 % IV SOLN: INTRAVENOUS | Status: DC

## 2024-02-29 MED ORDER — LIDOCAINE HCL (CARDIAC) PF 100 MG/5ML IV SOSY
PREFILLED_SYRINGE | INTRAVENOUS | Status: DC | PRN
  Administered 2024-02-29: 100 mg via INTRAVENOUS

## 2024-02-29 MED ORDER — PROPOFOL 10 MG/ML IV BOLUS
INTRAVENOUS | Status: DC | PRN
  Administered 2024-02-29: 140 ug/kg/min via INTRAVENOUS
  Administered 2024-02-29: 50 mg via INTRAVENOUS

## 2024-02-29 MED ORDER — PROPOFOL 10 MG/ML IV BOLUS
INTRAVENOUS | Status: AC
  Filled 2024-02-29: qty 20

## 2024-02-29 MED ORDER — FENTANYL CITRATE (PF) 100 MCG/2ML IJ SOLN
INTRAMUSCULAR | Status: AC
  Filled 2024-02-29: qty 2

## 2024-02-29 MED ORDER — FENTANYL CITRATE (PF) 100 MCG/2ML IJ SOLN
INTRAMUSCULAR | Status: DC | PRN
  Administered 2024-02-29: 50 ug via INTRAVENOUS

## 2024-02-29 MED ORDER — LIDOCAINE HCL (PF) 2 % IJ SOLN
INTRAMUSCULAR | Status: AC
  Filled 2024-02-29: qty 5

## 2024-02-29 MED ORDER — PROPOFOL 10 MG/ML IV BOLUS
INTRAVENOUS | Status: AC
  Filled 2024-02-29: qty 40

## 2024-02-29 NOTE — Transfer of Care (Signed)
 Immediate Anesthesia Transfer of Care Note  Patient: Amanda Ashley  Procedure(s) Performed: COLONOSCOPY EGD (ESOPHAGOGASTRODUODENOSCOPY)  Patient Location: PACU  Anesthesia Type:General  Level of Consciousness: awake and alert   Airway & Oxygen Therapy: Patient Spontanous Breathing  Post-op Assessment: Report given to RN and Post -op Vital signs reviewed and stable  Post vital signs: stable  Last Vitals:  Vitals Value Taken Time  BP 116/80 02/29/24 10:01  Temp    Pulse 94 02/29/24 10:02  Resp 15 02/29/24 10:02  SpO2 98 % 02/29/24 10:02  Vitals shown include unfiled device data.  Last Pain:  Vitals:   02/29/24 0834  TempSrc: Temporal         Complications: No notable events documented.

## 2024-02-29 NOTE — Op Note (Addendum)
 [facility] Patient Name: Amanda Ashley Procedure Date: 02/29/2024 9:09 AM MRN: [id number] Account #: [id number] Date of Birth: 1970/11/22 Admit Type: Outpatient Age: 54 Room: [facility] ENDO ROOM 1 Gender: Female Note Status: Finalized Instrument Name: Colon Scope [id number] Procedure:             Colonoscopy Indications:           Screening for colorectal malignant neoplasm Providers:             Jayson KIDD. Marinda, MD Referring MD:          Lavenia Beaver, MD (Referring MD) Medicines:             See the Anesthesia note for documentation of the                         administered medications Complications:         No immediate complications. Estimated blood loss:                         Minimal. Procedure:             Pre-Anesthesia Assessment:                        - Prior to the procedure, a History and Physical was                         performed, and patient medications and allergies were                         reviewed. The patient's tolerance of previous                         anesthesia was also reviewed. The risks and benefits                         of the procedure and the sedation options and risks                         were discussed with the patient. All questions were                         answered, and informed consent was obtained. Prior                         Anticoagulants: The patient has taken no anticoagulant                         or antiplatelet agents. ASA Grade Assessment: III - A                         patient with severe systemic disease. After reviewing                         the risks and benefits, the patient was deemed in                         satisfactory condition to undergo the procedure.  After obtaining informed consent, the colonoscope was                         passed under direct vision. Throughout the procedure,                         the patient's blood pressure, pulse,  and oxygen                         saturations were monitored continuously. The                         Colonoscope was introduced through the anus and                         advanced to the the cecum, identified by appendiceal                         orifice and ileocecal valve. The colonoscopy was                         performed without difficulty. The patient tolerated                         the procedure well. The quality of the bowel                         preparation was excellent. Findings:      The entire examined colon appeared normal on direct and retroflexion       views. Impression:            - The entire examined colon is normal on direct and                         retroflexion views.                        - No specimens collected. Recommendation:        - Discharge patient to home.                        - Resume previous diet.                        - Repeat colonoscopy in 10 years for screening                         purposes. Procedure Code(s):     --- Professional ---                        [phone], Colonoscopy, flexible; diagnostic, including                         collection of specimen(s) by brushing or washing, when                         performed (separate procedure) CPT copyright 2022 American Medical Association. All rights reserved. The codes documented in this report are preliminary and upon coder review may  be revised to meet current  compliance requirements. Jayson MALVA Endow, MD 02/29/2024 10:21:12 AM Number of Addenda: 0 Note Initiated On: 02/29/2024 9:09 AM      [facility]

## 2024-02-29 NOTE — Anesthesia Preprocedure Evaluation (Signed)
"                                    Anesthesia Evaluation  Patient identified by MRN, date of birth, ID band Patient awake    Reviewed: Allergy & Precautions, H&P , NPO status , Patient's Chart, lab work & pertinent test results  Airway Mallampati: II  TM Distance: >3 FB Neck ROM: full    Dental no notable dental hx.    Pulmonary neg pulmonary ROS   Pulmonary exam normal        Cardiovascular negative cardio ROS Normal cardiovascular exam     Neuro/Psych negative neurological ROS  negative psych ROS   GI/Hepatic Neg liver ROS, hiatal hernia,GERD  ,,Large hiatal hernia   Endo/Other  diabetes, Type 2    Renal/GU      Musculoskeletal   Abdominal   Peds  Hematology negative hematology ROS (+)   Anesthesia Other Findings Past Medical History: No date: Acid reflux No date: Arrhythmia No date: Calculus of gallbladder and bile duct with acute  cholecystitis without obstruction No date: DM (diabetes mellitus), type 2 (HCC)     Comment:  no meds No date: Elevated alkaline phosphatase level No date: Family history of adverse reaction to anesthesia     Comment:  mom-bp issues No date: Heart murmur No date: Paraesophageal hiatal hernia     Comment:  large No date: Ventral hernia  Past Surgical History: 02/03/2022: CHOLECYSTECTOMY; N/A     Comment:  Procedure: LAPAROSCOPIC CHOLECYSTECTOMY;  Surgeon:               Jordis Laneta FALCON, MD;  Location: [facility] ORS;  Service:               General;  Laterality: N/A; No date: OOPHORECTOMY; Right No date: TUBAL LIGATION     Reproductive/Obstetrics negative OB ROS                              Anesthesia Physical Anesthesia Plan  ASA: 2  Anesthesia Plan: General   Post-op Pain Management: Minimal or no pain anticipated   Induction: Intravenous  PONV Risk Score and Plan: 2 and Propofol  infusion and TIVA  Airway Management Planned: Nasal Cannula  Additional Equipment:  None  Intra-op Plan:   Post-operative Plan:   Informed Consent: I have reviewed the patients History and Physical, chart, labs and discussed the procedure including the risks, benefits and alternatives for the proposed anesthesia with the patient or authorized representative who has indicated his/her understanding and acceptance.     Dental advisory given  Plan Discussed with: CRNA and Surgeon  Anesthesia Plan Comments: (Discussed risks of anesthesia with patient, including possibility of difficulty with spontaneous ventilation under anesthesia necessitating airway intervention, PONV, and rare risks such as cardiac or respiratory or neurological events, and allergic reactions. Discussed the role of CRNA in patient's perioperative care. Patient understands.)        Anesthesia Quick Evaluation  "

## 2024-02-29 NOTE — H&P (Signed)
 Primary Care Physician:  Sherial Bail, MD Primary Gastroenterologist:  Dr. Marinda  Pre-Procedure History & Physical: HPI:  Amanda Ashley is a 54 y.o. female is here for an endoscopy and colonoscopy.   Past Medical History:  Diagnosis Date   Acid reflux    Arrhythmia    Calculus of gallbladder and bile duct with acute cholecystitis without obstruction    DM (diabetes mellitus), type 2 (HCC)    no meds   Elevated alkaline phosphatase level    Family history of adverse reaction to anesthesia    mom-bp issues   Heart murmur    Paraesophageal hiatal hernia    large   Ventral hernia     Past Surgical History:  Procedure Laterality Date   CHOLECYSTECTOMY N/A 02/03/2022   Procedure: LAPAROSCOPIC CHOLECYSTECTOMY;  Surgeon: Jordis Laneta FALCON, MD;  Location: [facility] ORS;  Service: General;  Laterality: N/A;   INSERTION OF MESH  12/01/2023   Procedure: INSERTION OF MESH;  Surgeon: Jordis Laneta FALCON, MD;  Location: [facility] OR[facility];  Service: General;;   OOPHORECTOMY Right    TUBAL LIGATION     XI ROBOTIC ASSISTED VENTRAL HERNIA N/A 12/01/2023   Procedure: REPAIR, HERNIA, VENTRAL, ROBOT-ASSISTED;  Surgeon: Jordis Laneta FALCON, MD;  Location: [facility] OR[facility];  Service: General;  Laterality: N/A;    Prior to Admission medications  Medication Sig Start Date End Date Taking? Authorizing Provider  cetirizine  (ZYRTEC  ALLERGY) 10 MG tablet Take 1 tablet (10 mg total) by mouth daily for 14 days. 12/09/23 01/04/24  Corlis Burnard DEL, NP    Allergies as of 02/21/2024   (No Known Allergies)    Family History  Problem Relation Age of Onset   Breast cancer Maternal Aunt     Social History   Socioeconomic History   Marital status: Married    Spouse name: Not on file   Number of children: Not on file   Years of education: Not on file   Highest education level: Not on file  Occupational History   Not on file  Tobacco Use   Smoking status: Never    Passive exposure: Never   Smokeless tobacco: Never  Vaping  Use   Vaping status: Never Used  Substance and Sexual Activity   Alcohol use: No   Drug use: No   Sexual activity: Not on file  Other Topics Concern   Not on file  Social History Narrative   Not on file   Social Drivers of Health   Tobacco Use: Low Risk (02/29/2024)   Patient History    Smoking Tobacco Use: Never    Smokeless Tobacco Use: Never    Passive Exposure: Never  Financial Resource Strain: Low Risk  (07/29/2022)   Received from [facility]   Overall Financial Resource Strain (CARDIA)    Difficulty of Paying Living Expenses: Not hard at all  Food Insecurity: No Food Insecurity (07/29/2022)   Received from [facility]   Epic    Within the past 12 months, you worried that your food would run out before you got the money to buy more.: Never true    Within the past 12 months, the food you bought just didn't last and you didn't have money to get more.: Never true  Transportation Needs: No Transportation Needs (07/29/2022)   Received from [facility]   PRAPARE - Transportation    Lack of Transportation (Non-Medical): No    In the past 12 months, has lack of  transportation kept you from medical appointments or from getting medications?: No  Physical Activity: Not on file  Stress: Not on file  Social Connections: Not on file  Intimate Partner Violence: Not At Risk (02/03/2022)   Humiliation, Afraid, Rape, and Kick questionnaire    Fear of Current or Ex-Partner: No    Emotionally Abused: No    Physically Abused: No    Sexually Abused: No  Depression (PHQ2-9): Not on file  Alcohol Screen: Not on file  Housing: Unknown (07/21/2023)   Received from [facility]   Epic    In the last 12 months, was there a time when you were not able to pay the mortgage or rent on time?: No    Number of Times Moved in the Last Year: Not on file    At any time in the past 12 months, were you homeless or living in a shelter  (including now)?: No  Utilities: Not At Risk (07/29/2022)   Received from [facility] Utilities    Threatened with loss of utilities: No  Health Literacy: Not on file    Review of Systems: See HPI, otherwise negative ROS  Physical Exam: BP 125/77   Pulse 86   Temp (!) 96.4 F (35.8 C) (Temporal)   Wt 102.1 kg   LMP 07/30/2020   SpO2 97%   BMI 37.44 kg/m  General:   Alert,  pleasant and cooperative in NAD Head:  Normocephalic and atraumatic. Neck:  Supple; no masses or thyromegaly. Lungs:  Clear throughout to auscultation.    Heart:  Regular rate and rhythm. Abdomen:  Soft, nontender and nondistended. Normal bowel sounds, without guarding, and without rebound.   Neurologic:  Alert and  oriented x4;  grossly normal neurologically.  Impression/Plan: Amanda Ashley is here for an endoscopy and colonoscopy to be performed for hiatal hernia and screening.  Risks, benefits, limitations, and alternatives regarding  endoscopy and colonoscopy have been reviewed with the patient.  Questions have been answered.  All parties agreeable.   Jayson MALVA Endow, MD  02/29/2024, 9:15 AM

## 2024-02-29 NOTE — Anesthesia Postprocedure Evaluation (Signed)
"   Anesthesia Post Note  Patient: Amanda Ashley  Procedure(s) Performed: COLONOSCOPY EGD (ESOPHAGOGASTRODUODENOSCOPY)  Patient location during evaluation: Endoscopy Anesthesia Type: General Level of consciousness: awake and alert Pain management: pain level controlled Vital Signs Assessment: post-procedure vital signs reviewed and stable Respiratory status: spontaneous breathing, nonlabored ventilation, respiratory function stable and patient connected to nasal cannula oxygen Cardiovascular status: blood pressure returned to baseline and stable Postop Assessment: no apparent nausea or vomiting Anesthetic complications: no   No notable events documented.   Last Vitals:  Vitals:   02/29/24 0834 02/29/24 1001  BP: 125/77 116/80  Pulse: 86 91  Resp:  17  Temp: (!) 35.8 C (!) 36 C  SpO2: 97% 96%    Last Pain:  Vitals:   02/29/24 1001  TempSrc: Temporal  PainSc: 0-No pain                 [name]      "

## 2024-02-29 NOTE — Op Note (Signed)
 [facility] Gastroenterology Patient Name: Amanda Ashley Procedure Date: 02/29/2024 9:09 AM MRN: [id number] Account #: [id number] Date of Birth: 1970-04-02 Admit Type: Outpatient Age: 54 Room: Room 1 Gender: Female Note Status: Finalized Instrument Name: Upper GI Scope [id number] Procedure:             Upper GI endoscopy Indications:           Hiatal hernia Providers:             Jayson KIDD. Marinda, MD Referring MD:          Lavenia Beaver, MD (Referring MD) Medicines:             See the Anesthesia note for documentation of the                         administered medications Complications:         No immediate complications. Estimated blood loss:                         Minimal. Procedure:             Pre-Anesthesia Assessment:                        - Prior to the procedure, a History and Physical was                         performed, and patient medications and allergies were                         reviewed. The patient's tolerance of previous                         anesthesia was also reviewed. The risks and benefits                         of the procedure and the sedation options and risks                         were discussed with the patient. All questions were                         answered, and informed consent was obtained. Prior                         Anticoagulants: The patient has taken no anticoagulant                         or antiplatelet agents. ASA Grade Assessment: II - A                         patient with mild systemic disease. After reviewing                         the risks and benefits, the patient was deemed in                         satisfactory condition to undergo the procedure.  After obtaining informed consent, the endoscope was                         passed under direct vision. Throughout the procedure,                         the patient's blood pressure, pulse, and oxygen                          saturations were monitored continuously. The Endoscope                         was introduced through the mouth, and advanced to the                         second part of duodenum. The upper GI endoscopy was                         accomplished without difficulty. The patient tolerated                         the procedure well. Findings:      The Z-line was regular and was found 33 cm from the incisors.      A large hiatal hernia was present.      Biopsies were taken with a cold forceps in the prepyloric region of the       stomach for Helicobacter pylori testing.      A large hiatal hernia was found. The proximal extent of the gastric       folds (end of tubular esophagus) was 34 cm from the incisors. The hiatal       narrowing was 42 cm from the incisors. The Z-line was 33 cm from the       incisors. Impression:            - Z-line regular, 33 cm from the incisors.                        - Large hiatal hernia.                        - Large hiatal hernia.                        - Biopsies were taken with a cold forceps for                         Helicobacter pylori testing. Recommendation:        - Discharge patient to home (ambulatory).                        - Resume previous diet.                        - Return to referring physician. Procedure Code(s):     --- Professional ---                        [id number], Esophagogastroduodenoscopy, flexible,  transoral; with biopsy, single or multiple CPT copyright 2022 [employer]. All rights reserved. The codes documented in this report are preliminary and upon coder review may  be revised to meet current compliance requirements. Jayson MALVA Endow, MD 02/29/2024 10:18:17 AM Number of Addenda: 0 Note Initiated On: 02/29/2024 9:09 AM Scope Withdrawal Time: 0 hours 11 minutes 0 seconds  Total Procedure Duration: 0 hours 21 minutes 39 seconds  Estimated Blood Loss:  Estimated blood loss was minimal.       [facility]

## 2024-03-01 LAB — SURGICAL PATHOLOGY

## 2024-03-29 ENCOUNTER — Other Ambulatory Visit: Payer: Self-pay | Admitting: Internal Medicine

## 2024-03-29 DIAGNOSIS — Z1231 Encounter for screening mammogram for malignant neoplasm of breast: Secondary | ICD-10-CM

## 2024-04-20 ENCOUNTER — Encounter
